# Patient Record
Sex: Male | Born: 1960
Health system: Southern US, Community
[De-identification: ages and names within clinical notes are randomized; demographics above are authoritative.]

## PROBLEM LIST (undated history)

## (undated) DIAGNOSIS — W57XXXA Bitten or stung by nonvenomous insect and other nonvenomous arthropods, initial encounter: Secondary | ICD-10-CM

## (undated) DIAGNOSIS — K219 Gastro-esophageal reflux disease without esophagitis: Secondary | ICD-10-CM

## (undated) DIAGNOSIS — R51 Headache: Secondary | ICD-10-CM

## (undated) DIAGNOSIS — R519 Headache, unspecified: Secondary | ICD-10-CM

## (undated) DIAGNOSIS — I10 Essential (primary) hypertension: Secondary | ICD-10-CM

## (undated) HISTORY — DX: Essential (primary) hypertension: I10

## (undated) HISTORY — PX: TONSILLECTOMY: SUR1361

---

## 1998-03-17 ENCOUNTER — Emergency Department (HOSPITAL_COMMUNITY): Admission: EM | Admit: 1998-03-17 | Discharge: 1998-03-17 | Payer: Self-pay | Admitting: Emergency Medicine

## 2000-05-07 ENCOUNTER — Emergency Department (HOSPITAL_COMMUNITY): Admission: EM | Admit: 2000-05-07 | Discharge: 2000-05-07 | Payer: Self-pay

## 2001-03-06 ENCOUNTER — Encounter: Admission: RE | Admit: 2001-03-06 | Discharge: 2001-03-06 | Payer: Self-pay | Admitting: Internal Medicine

## 2001-03-06 ENCOUNTER — Encounter: Payer: Self-pay | Admitting: Internal Medicine

## 2001-03-11 ENCOUNTER — Encounter: Admission: RE | Admit: 2001-03-11 | Discharge: 2001-03-11 | Payer: Self-pay | Admitting: Internal Medicine

## 2001-03-11 ENCOUNTER — Encounter: Payer: Self-pay | Admitting: Internal Medicine

## 2002-07-10 ENCOUNTER — Encounter: Payer: Self-pay | Admitting: Emergency Medicine

## 2002-07-10 ENCOUNTER — Emergency Department (HOSPITAL_COMMUNITY): Admission: AC | Admit: 2002-07-10 | Discharge: 2002-07-10 | Payer: Self-pay | Admitting: Emergency Medicine

## 2010-04-16 ENCOUNTER — Encounter: Payer: Self-pay | Admitting: Internal Medicine

## 2012-09-22 ENCOUNTER — Other Ambulatory Visit: Payer: Self-pay | Admitting: Neurological Surgery

## 2012-09-22 DIAGNOSIS — M5412 Radiculopathy, cervical region: Secondary | ICD-10-CM

## 2012-10-03 ENCOUNTER — Other Ambulatory Visit: Payer: Self-pay

## 2013-05-14 ENCOUNTER — Other Ambulatory Visit: Payer: Self-pay | Admitting: *Deleted

## 2013-05-14 DIAGNOSIS — R002 Palpitations: Secondary | ICD-10-CM

## 2013-05-28 ENCOUNTER — Encounter (INDEPENDENT_AMBULATORY_CARE_PROVIDER_SITE_OTHER): Payer: BC Managed Care – PPO

## 2013-05-28 ENCOUNTER — Encounter: Payer: Self-pay | Admitting: *Deleted

## 2013-05-28 DIAGNOSIS — R002 Palpitations: Secondary | ICD-10-CM

## 2013-05-28 NOTE — Progress Notes (Signed)
Patient ID: Alexander Gross, male   DOB: 1960/04/29, 53 y.o.   MRN: [id number] E-Cardio 24 hour holter monitor applied to patient.

## 2015-07-29 DIAGNOSIS — L719 Rosacea, unspecified: Secondary | ICD-10-CM | POA: Diagnosis not present

## 2015-07-29 DIAGNOSIS — B352 Tinea manuum: Secondary | ICD-10-CM | POA: Diagnosis not present

## 2015-10-24 ENCOUNTER — Ambulatory Visit (INDEPENDENT_AMBULATORY_CARE_PROVIDER_SITE_OTHER): Payer: BLUE CROSS/BLUE SHIELD | Admitting: Physician Assistant

## 2015-10-24 ENCOUNTER — Ambulatory Visit (HOSPITAL_COMMUNITY)
Admission: RE | Admit: 2015-10-24 | Discharge: 2015-10-24 | Disposition: A | Discharge status: Home / Self Care | Payer: BLUE CROSS/BLUE SHIELD | Source: Ambulatory Visit | Attending: Physician Assistant | Admitting: Physician Assistant

## 2015-10-24 ENCOUNTER — Ambulatory Visit (INDEPENDENT_AMBULATORY_CARE_PROVIDER_SITE_OTHER): Payer: BLUE CROSS/BLUE SHIELD

## 2015-10-24 VITALS — BP 128/90 | HR 114 | Temp 99.1°F | Resp 20 | Ht 68.0 in | Wt 162.0 lb

## 2015-10-24 DIAGNOSIS — J329 Chronic sinusitis, unspecified: Secondary | ICD-10-CM | POA: Diagnosis not present

## 2015-10-24 DIAGNOSIS — Z8661 Personal history of infections of the central nervous system: Secondary | ICD-10-CM

## 2015-10-24 DIAGNOSIS — H53149 Visual discomfort, unspecified: Secondary | ICD-10-CM | POA: Diagnosis present

## 2015-10-24 DIAGNOSIS — R51 Headache: Secondary | ICD-10-CM | POA: Diagnosis present

## 2015-10-24 DIAGNOSIS — J338 Other polyp of sinus: Secondary | ICD-10-CM

## 2015-10-24 DIAGNOSIS — R6889 Other general symptoms and signs: Secondary | ICD-10-CM | POA: Diagnosis not present

## 2015-10-24 DIAGNOSIS — R5081 Fever presenting with conditions classified elsewhere: Secondary | ICD-10-CM | POA: Diagnosis not present

## 2015-10-24 DIAGNOSIS — A879 Viral meningitis, unspecified: Secondary | ICD-10-CM | POA: Diagnosis not present

## 2015-10-24 DIAGNOSIS — R000 Tachycardia, unspecified: Secondary | ICD-10-CM | POA: Diagnosis not present

## 2015-10-24 DIAGNOSIS — G039 Meningitis, unspecified: Secondary | ICD-10-CM | POA: Diagnosis not present

## 2015-10-24 DIAGNOSIS — R05 Cough: Secondary | ICD-10-CM | POA: Diagnosis not present

## 2015-10-24 DIAGNOSIS — R519 Headache, unspecified: Secondary | ICD-10-CM

## 2015-10-24 DIAGNOSIS — W57XXXA Bitten or stung by nonvenomous insect and other nonvenomous arthropods, initial encounter: Secondary | ICD-10-CM | POA: Diagnosis not present

## 2015-10-24 DIAGNOSIS — R509 Fever, unspecified: Secondary | ICD-10-CM | POA: Diagnosis not present

## 2015-10-24 DIAGNOSIS — J309 Allergic rhinitis, unspecified: Secondary | ICD-10-CM | POA: Diagnosis not present

## 2015-10-24 LAB — POCT CBC
GRANULOCYTE PERCENT: 78.7 % (ref 37–80)
HEMATOCRIT: 45.1 % (ref 43.5–53.7)
HEMOGLOBIN: 16 g/dL (ref 14.1–18.1)
Lymph, poc: 0.9 (ref 0.6–3.4)
MCH: 33.2 pg — AB (ref 27–31.2)
MCHC: 35.6 g/dL — AB (ref 31.8–35.4)
MCV: 93.3 fL (ref 80–97)
MID (cbc): 0.5 (ref 0–0.9)
MPV: 6.2 fL (ref 0–99.8)
POC GRANULOCYTE: 5.4 (ref 2–6.9)
POC LYMPH PERCENT: 13.4 %L (ref 10–50)
POC MID %: 7.9 % (ref 0–12)
Platelet Count, POC: 192 10*3/uL (ref 142–424)
RBC: 4.83 M/uL (ref 4.69–6.13)
RDW, POC: 12 %
WBC: 6.9 10*3/uL (ref 4.6–10.2)

## 2015-10-24 IMAGING — CT CT HEAD W/O CM
1 series · 16 of 30 positions shown, 20 images · non-contrast
Comparison: None.

CLINICAL DATA: Headache for 2 days

EXAM:
CT HEAD WITHOUT CONTRAST
TECHNIQUE: Contiguous axial images were obtained from the base of the skull
through the vertex without intravenous contrast.

[Series 2: head w/o · axial · non-contrast · 0.45mm/px · z∈[+157,+296]mm · 16 of 32 slices shown, 20 images]
[im 2/32  brain]
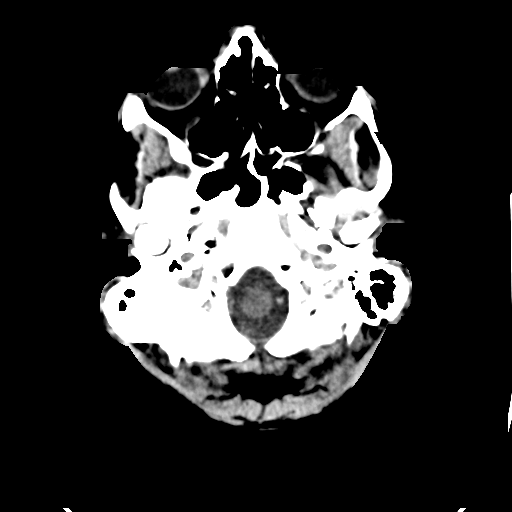
[im 2/32  bone]
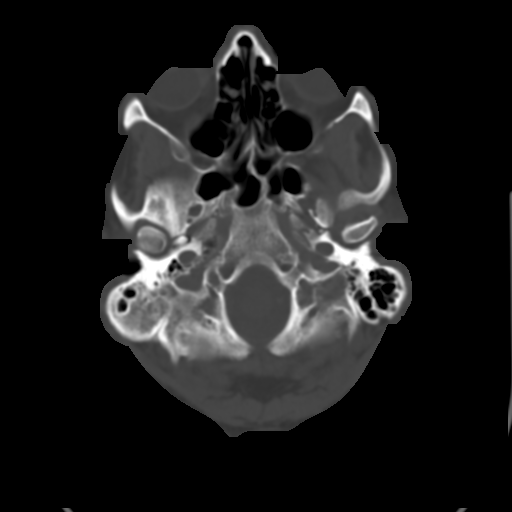
[im 4/32  brain]
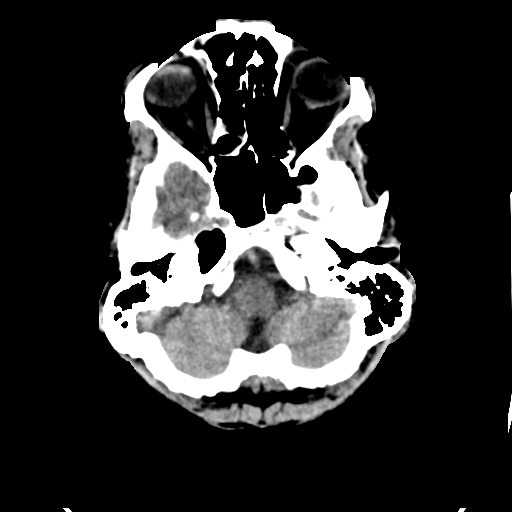
[im 6/32  brain]
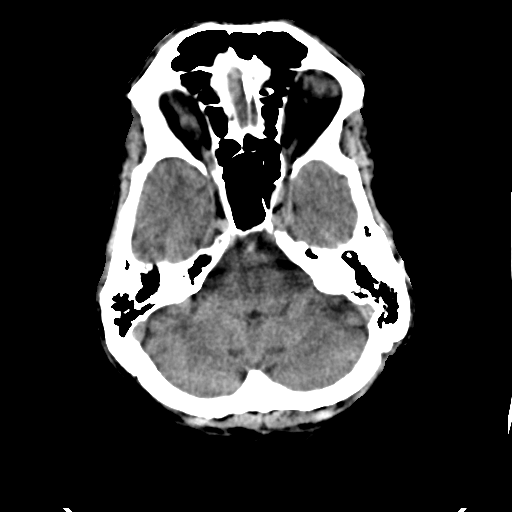
[im 8/32  brain]
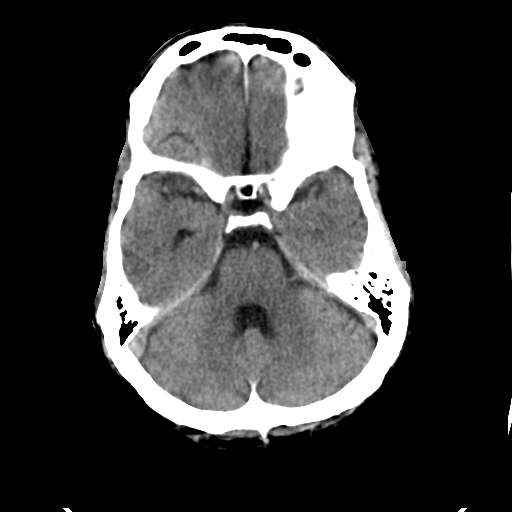
[im 9/32  brain]
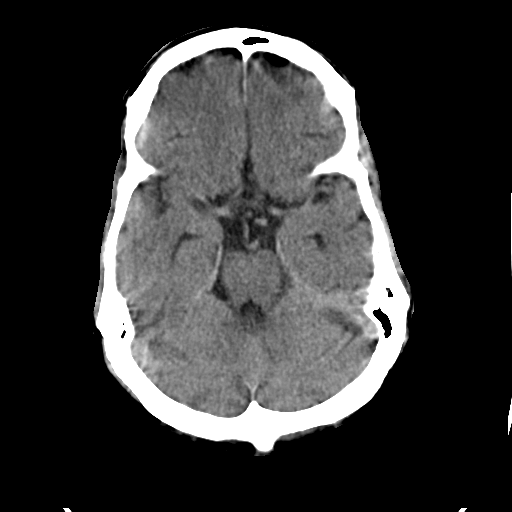
[im 9/32  bone]
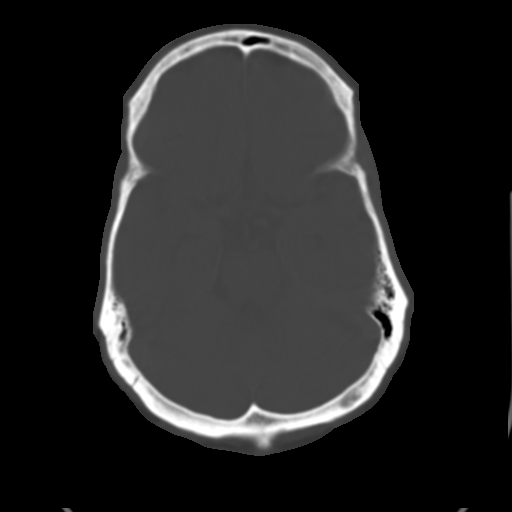
[im 11/32  brain]
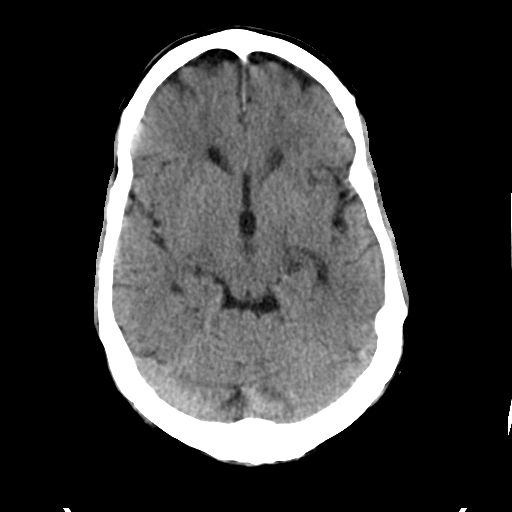
[im 13/32  brain]
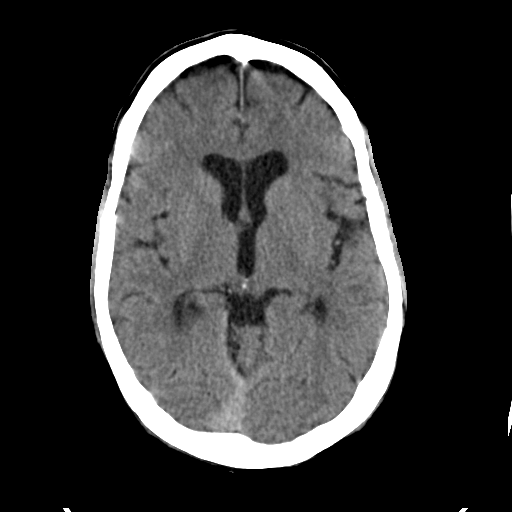
[im 15/32  brain]
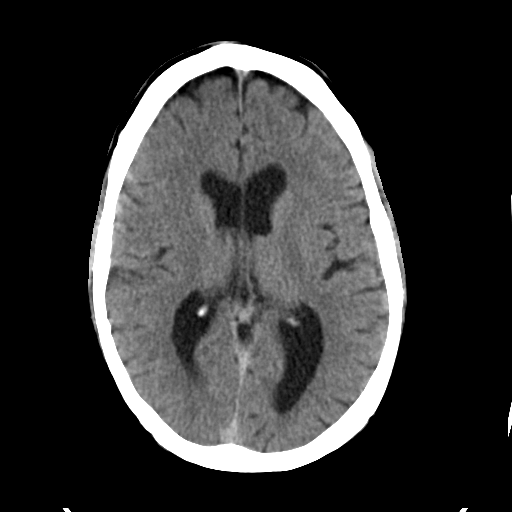
[im 17/32  brain]
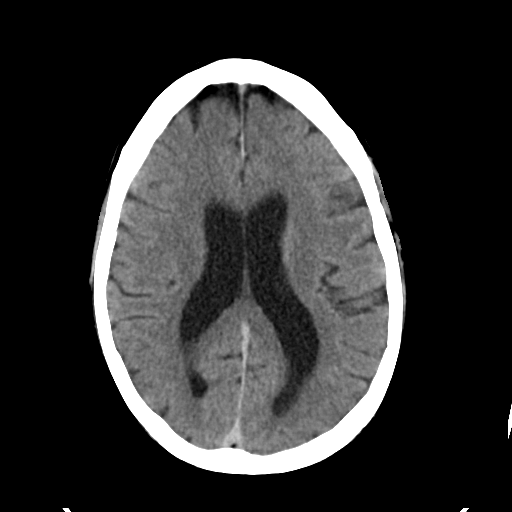
[im 17/32  bone]
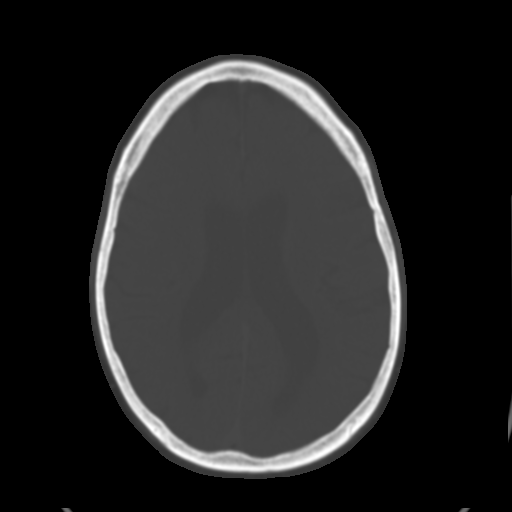
[im 19/32  brain]
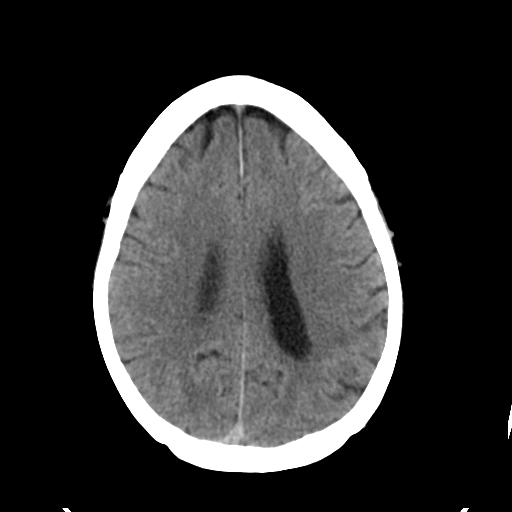
[im 21/32  brain]
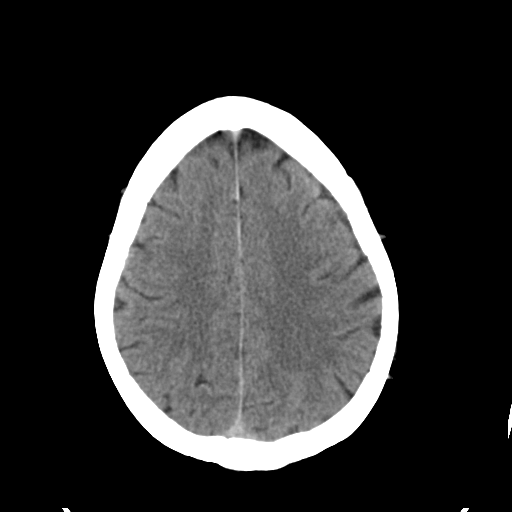
[im 23/32  brain]
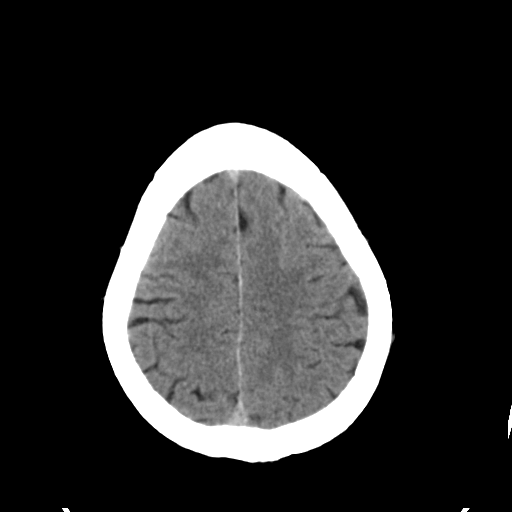
[im 24/32  brain]
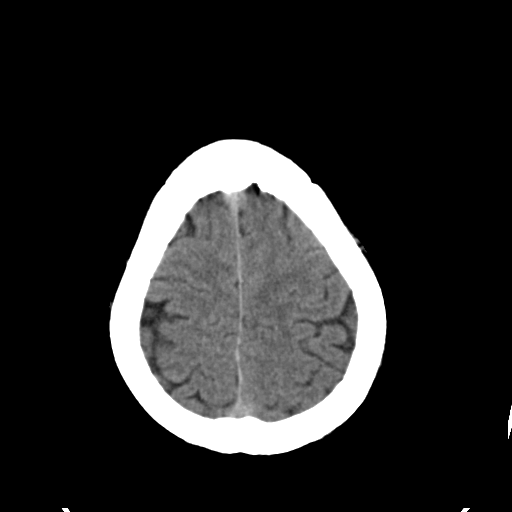
[im 24/32  bone]
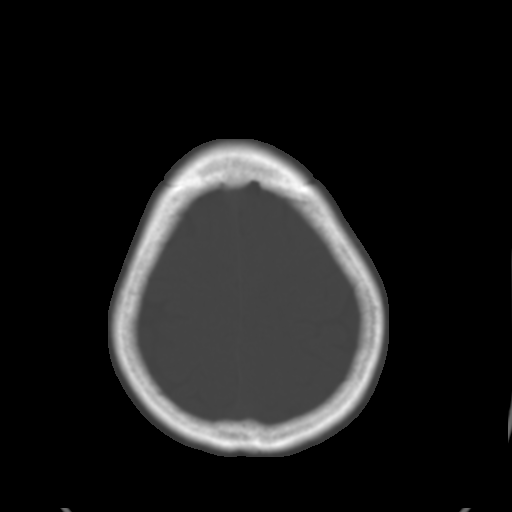
[im 26/32  brain]
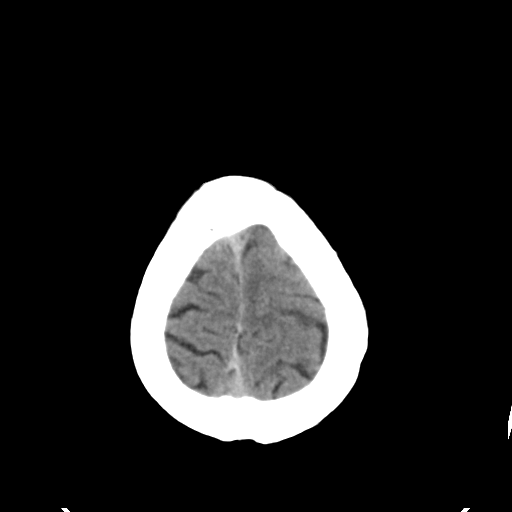
[im 28/32  brain]
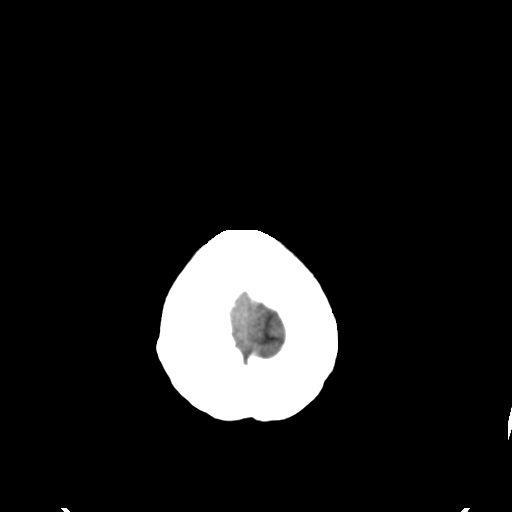
[im 30/32  brain]
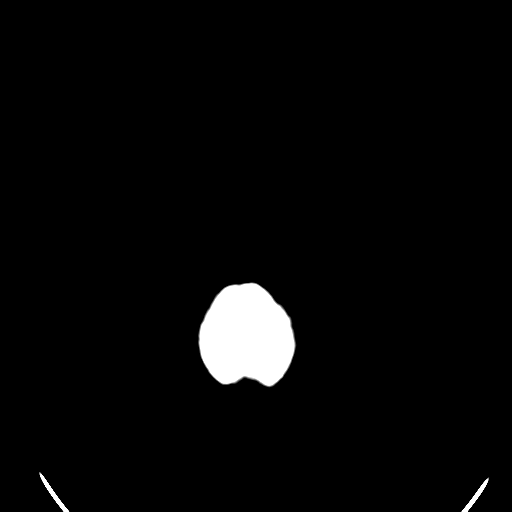

[16 of 30 positions shown; findings below may reference images not displayed]

FINDINGS: There is streak artifact over the right frontal lobe laterally. No
obvious acute hemorrhage. No mass effect midline shift. Mastoid air
cells are clear. Visualized paranasal sinuses are clear. Intact
cranium.
IMPRESSION: No acute intracranial pathology.

## 2015-10-24 IMAGING — DX DG SINUSES 1-2V
1 series · 1 of 1 positions shown · non-contrast
Comparison: Report of head CT scan [DATE] which
indicated the paranasal sinuses were normal where visualized.

CLINICAL DATA: Three days of headache with dental pain; history of
uncontrolled allergies

EXAM:
PARANASAL SINUSES - 1-2 VIEW

[skull waters]
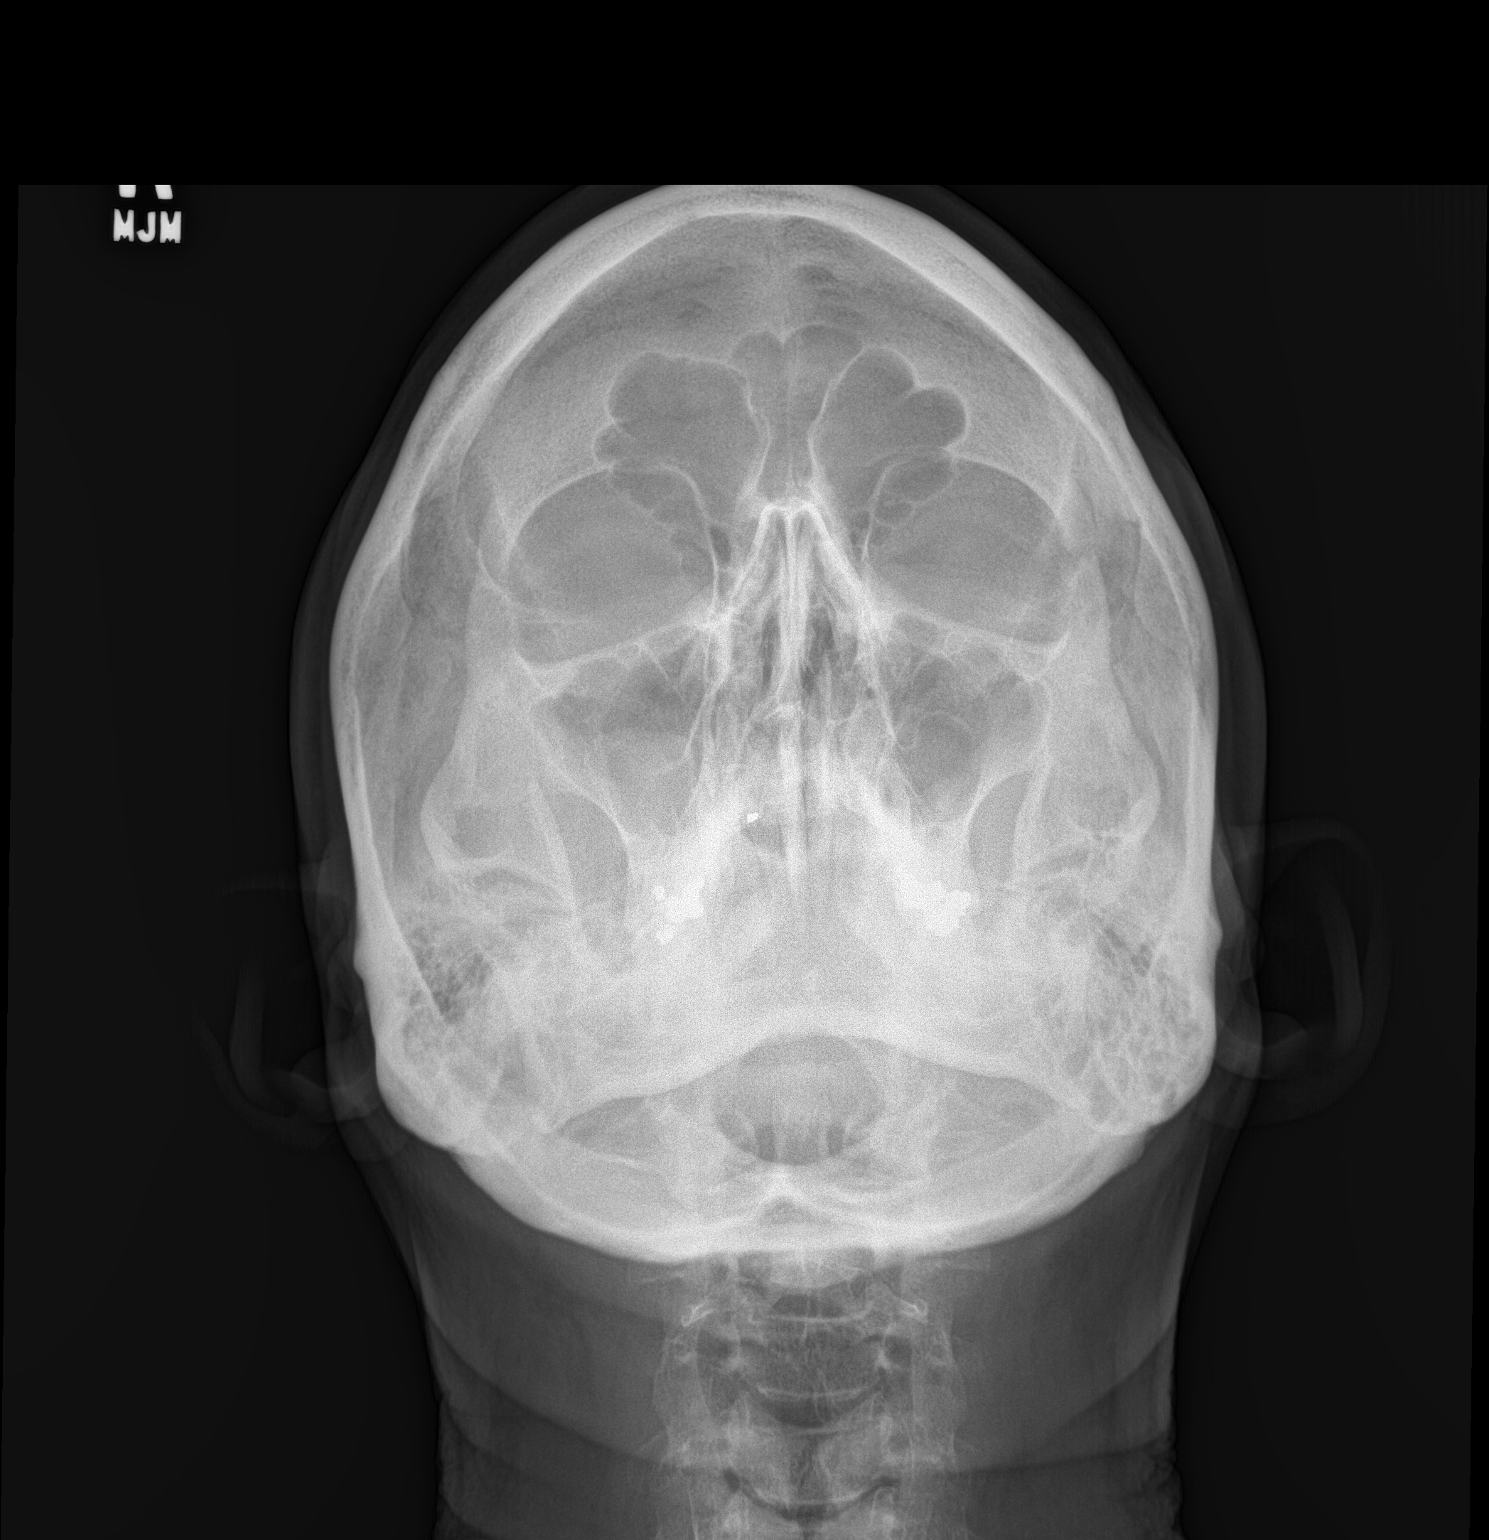

[1 of 1 positions shown; findings below may reference images not displayed]

FINDINGS: There is soft tissue fullness in the inferior aspect of the right
maxillary sinus consistent with a polyp or asymmetric mucoperiosteal
thickening. The left maxillary sinus is clear. The frontal and
ethmoid sinuses are clear.
IMPRESSION: Probable retention cyst or polyp inferiorly in the right maxillary
sinus measuring up to 2 cm in diameter. No air-fluid levels are
observed.

## 2015-10-24 MED ORDER — AMOXICILLIN-POT CLAVULANATE 875-125 MG PO TABS: 1.0000 | ORAL_TABLET | Freq: Two times a day (BID) | ORAL | 0 refills | Status: DC

## 2015-10-24 MED ORDER — GUAIFENESIN ER 1200 MG PO TB12: 1.0000 | ORAL_TABLET | Freq: Two times a day (BID) | ORAL | 0 refills | Status: AC

## 2015-10-24 MED ORDER — HYDROCODONE-ACETAMINOPHEN 5-325 MG PO TABS: 1.0000 | ORAL_TABLET | Freq: Four times a day (QID) | ORAL | 0 refills | Status: DC | PRN

## 2015-10-24 NOTE — Progress Notes (Signed)
Alexander Gross  MRN: [id number] DOB: 18-Aug-1960  Subjective:  Pt presents to clinic with headache and chills that started 3 days ago and is not getting any better - the headache does have moments of improving but it has not resolved.  He has tried tylenol and motrin and neither has helped his headache.  The headache was not abrupt in start but it has progressively worsened.  He feels like he has a sinus infection because his upper teeth hurt and he has allergies that he cannot control.  He is having no nasal drainage but he has a lot of PND esp when showering and that has not changed since his symptoms started.  He has used tylenol and motrin without any relief in his symptoms.  He is having no weakness or paresthesias.  He does not feel sick but he is weak because he has not slept from the pain.  He cannot control his shaking but he is not cold.  Pt has h/o migraines but this feel different - he is having some photophobia today with the headache.  He has known allergies - claritin and flonase daily - he does not feel like it is helping - his PCP has been work with him  Review of Systems  Constitutional: Positive for chills. Negative for fever.  HENT: Positive for dental problem (upper teeth pain) and sinus pressure. Negative for facial swelling and sore throat.   Respiratory: Negative for cough, shortness of breath and wheezing.   Allergic/Immunologic: Positive for environmental allergies.  Neurological: Positive for headaches. Negative for dizziness, facial asymmetry, weakness and light-headedness.    There are no active problems to display for this patient.   No current outpatient prescriptions on file prior to visit.   No current facility-administered medications on file prior to visit.     No Known Allergies  Objective:  BP 128/90 (BP Location: Left Arm, Patient Position: Sitting, Cuff Size: Normal)   Pulse (!) 114   Temp 99.1 F (37.3 C) (Oral)   Resp 20   Ht 5\' 8"  (1.727  m)   Wt 162 lb (73.5 kg)   BMI 24.63 kg/m   Physical Exam  Constitutional: He is oriented to person, place, and time and well-developed, well-nourished, and in no distress.  Rigors, appears to not feel well  HENT:  Head: Normocephalic and atraumatic.  Right Ear: Hearing, tympanic membrane, external ear and ear canal normal.  Left Ear: Hearing, tympanic membrane, external ear and ear canal normal.  Nose: Mucosal edema (red) present.  Mouth/Throat: Uvula is midline, oropharynx is clear and moist and mucous membranes are normal.  Eyes: Conjunctivae, EOM and lids are normal. Pupils are equal, round, and reactive to light.  No photophobia  Neck: Normal range of motion.  Cardiovascular: Normal rate, regular rhythm and normal heart sounds.   No murmur heard. Pulmonary/Chest: Effort normal and breath sounds normal. He has no wheezes.  Lymphadenopathy:       Head (right side): No tonsillar adenopathy present.       Head (left side): No tonsillar adenopathy present.    He has no cervical adenopathy.       Right: No supraclavicular adenopathy present.       Left: No supraclavicular adenopathy present.  Neurological: He is alert and oriented to person, place, and time. Gait normal.  Skin: Skin is warm and dry.  Psychiatric: Mood, memory, affect and judgment normal.   Dg Sinus 1-2 Views  Result Date: 10/24/2015 CLINICAL DATA:  Three days of headache with dental pain; history of uncontrolled allergies EXAM: PARANASAL SINUSES - 1-2 VIEW COMPARISON:  Report of head CT scan of July 10, 2002 which indicated the paranasal sinuses were normal where visualized. FINDINGS: There is soft tissue fullness in the inferior aspect of the right maxillary sinus consistent with a polyp or asymmetric mucoperiosteal thickening. The left maxillary sinus is clear. The frontal and ethmoid sinuses are clear. IMPRESSION: Probable retention cyst or polyp inferiorly in the right maxillary sinus measuring up to 2 cm in  diameter. No air-fluid levels are observed. Electronically Signed   By: David  [place] M.D.   On: 10/24/2015 10:43  Ct Head Wo Contrast  Result Date: 10/24/2015 CLINICAL DATA:  Headache for 2 days EXAM: CT HEAD WITHOUT CONTRAST TECHNIQUE: Contiguous axial images were obtained from the base of the skull through the vertex without intravenous contrast. COMPARISON:  None. FINDINGS: There is streak artifact over the right frontal lobe laterally. No obvious acute hemorrhage. No mass effect midline shift. Mastoid air cells are clear. Visualized paranasal sinuses are clear. Intact cranium. IMPRESSION: No acute intracranial pathology. Electronically Signed   By: Jolaine Click M.D.   On: 10/24/2015 12:57   Results for orders placed or performed in visit on 10/24/15  POCT CBC  Result Value Ref Range   WBC 6.9 4.6 - 10.2 K/uL   Lymph, poc 0.9 0.6 - 3.4   POC LYMPH PERCENT 13.4 10 - 50 %L   MID (cbc) 0.5 0 - 0.9   POC MID % 7.9 0 - 12 %M   POC Granulocyte 5.4 2 - 6.9   Granulocyte percent 78.7 37 - 80 %G   RBC 4.83 4.69 - 6.13 M/uL   Hemoglobin 16.0 14.1 - 18.1 g/dL   HCT, POC 40.9 81.1 - 53.7 %   MCV 93.3 80 - 97 fL   MCH, POC 33.2 (A) 27 - 31.2 pg   MCHC 35.6 (A) 31.8 - 35.4 g/dL   RDW, POC 91.4 %   Platelet Count, POC 192 142 - 424 K/uL   MPV 6.2 0 - 99.8 fL    Assessment and Plan :  Acute intractable headache, unspecified headache type - Plan: POCT CBC, DG SinUS 1-2 Views, HYDROcodone-acetaminophen (NORCO/VICODIN) 5-325 MG tablet, CT Head Wo Contrast, CANCELED: CT Head Wo Contrast  Allergic rhinitis, unspecified allergic rhinitis type  Maxillary sinus polyp - Plan: Ambulatory referral to ENT, amoxicillin-clavulanate (AUGMENTIN) 875-125 MG tablet, Guaifenesin (MUCINEX MAXIMUM STRENGTH) 1200 MG TB12   Pt has normal CBC and water's sinus film did not show air fluid levels - the headache seems severe for a sinus infection and with the fact that it is worsening and the patient has not been able to  sleep we will get a head CT - he was given pain medications that should help with his pain - he will be treated based on his CT results.  He understood and agreed with the plan.  CT was normal - this is likely a migraine - I sent in abx and mucinex if the patient's headache is not resolved with the narcotic medication he was given due to uncontrolled allergies.  He will go to the referral for ENT due to possible sinus polyp - he was instructed to continue Flonase which may help decrease this in size --  If the pain worsens he should be re-evaluated  Benny Lennert PA-C  [facility] 10/24/2015 1:21 PM

## 2015-10-24 NOTE — Patient Instructions (Addendum)
You are to go over to [facility] now for your CT scan now.  Address: [address] [place], [place], [place] [address]  [phone]   We need to do imaging of your head to r/o problems with your brain - I have given you pain medication to help with the pain. When I get the results the treatment weill be determined.  I have also referred you to ENT because of the polyp in your right sinus cavity.    IF you received an x-ray today, you will receive an invoice from [facility]. Please contact [facility] at [phone] with questions or concerns regarding your invoice.   IF you received labwork today, you will receive an invoice from [employer]. Please contact [facility] at [phone] with questions or concerns regarding your invoice.   Our billing staff will not be able to assist you with questions regarding bills from these companies.  You will be contacted with the lab results as soon as they are available. The fastest way to get your results is to activate your My Chart account. Instructions are located on the last page of this paperwork. If you have not heard from [place] regarding the results in 2 weeks, please contact this office.

## 2015-10-25 ENCOUNTER — Encounter (HOSPITAL_COMMUNITY): Payer: Self-pay | Admitting: Emergency Medicine

## 2015-10-25 ENCOUNTER — Emergency Department (HOSPITAL_COMMUNITY)
Admission: EM | Admit: 2015-10-25 | Discharge: 2015-10-26 | Disposition: A | Discharge status: Home / Self Care | Payer: BLUE CROSS/BLUE SHIELD | Source: Home / Self Care | Attending: Emergency Medicine | Admitting: Emergency Medicine

## 2015-10-25 ENCOUNTER — Telehealth: Payer: Self-pay

## 2015-10-25 ENCOUNTER — Emergency Department (HOSPITAL_COMMUNITY): Payer: BLUE CROSS/BLUE SHIELD

## 2015-10-25 DIAGNOSIS — Z792 Long term (current) use of antibiotics: Secondary | ICD-10-CM | POA: Insufficient documentation

## 2015-10-25 DIAGNOSIS — R509 Fever, unspecified: Secondary | ICD-10-CM

## 2015-10-25 DIAGNOSIS — Z79899 Other long term (current) drug therapy: Secondary | ICD-10-CM | POA: Insufficient documentation

## 2015-10-25 DIAGNOSIS — R05 Cough: Secondary | ICD-10-CM

## 2015-10-25 DIAGNOSIS — R059 Cough, unspecified: Secondary | ICD-10-CM

## 2015-10-25 DIAGNOSIS — F17290 Nicotine dependence, other tobacco product, uncomplicated: Secondary | ICD-10-CM | POA: Insufficient documentation

## 2015-10-25 DIAGNOSIS — R231 Pallor: Secondary | ICD-10-CM | POA: Insufficient documentation

## 2015-10-25 LAB — I-STAT CHEM 8, ED
BUN: 4 mg/dL — AB (ref 6–20)
CALCIUM ION: 1.13 mmol/L (ref 1.13–1.30)
Chloride: 95 mmol/L — ABNORMAL LOW (ref 101–111)
Creatinine, Ser: 0.9 mg/dL (ref 0.61–1.24)
Glucose, Bld: 119 mg/dL — ABNORMAL HIGH (ref 65–99)
HEMATOCRIT: 44 % (ref 39.0–52.0)
HEMOGLOBIN: 15 g/dL (ref 13.0–17.0)
Potassium: 3.7 mmol/L (ref 3.5–5.1)
SODIUM: 132 mmol/L — AB (ref 135–145)
TCO2: 25 mmol/L (ref 0–100)

## 2015-10-25 LAB — CBC WITH DIFFERENTIAL/PLATELET
BASOS PCT: 0 %
Basophils Absolute: 0 10*3/uL (ref 0.0–0.1)
EOS ABS: 0 10*3/uL (ref 0.0–0.7)
EOS PCT: 0 %
HCT: 40.9 % (ref 39.0–52.0)
HEMOGLOBIN: 14.8 g/dL (ref 13.0–17.0)
LYMPHS ABS: 1 10*3/uL (ref 0.7–4.0)
Lymphocytes Relative: 10 %
MCH: 32.9 pg (ref 26.0–34.0)
MCHC: 36.2 g/dL — AB (ref 30.0–36.0)
MCV: 90.9 fL (ref 78.0–100.0)
MONO ABS: 1 10*3/uL (ref 0.1–1.0)
MONOS PCT: 10 %
NEUTROS PCT: 80 %
Neutro Abs: 8.2 10*3/uL — ABNORMAL HIGH (ref 1.7–7.7)
Platelets: 179 10*3/uL (ref 150–400)
RBC: 4.5 MIL/uL (ref 4.22–5.81)
RDW: 11.8 % (ref 11.5–15.5)
WBC: 10.1 10*3/uL (ref 4.0–10.5)

## 2015-10-25 LAB — I-STAT CG4 LACTIC ACID, ED: Lactic Acid, Venous: 0.91 mmol/L (ref 0.5–1.9)

## 2015-10-25 MED ORDER — METOCLOPRAMIDE HCL 5 MG/ML IJ SOLN
10.0000 mg | Freq: Once | INTRAMUSCULAR | Status: AC
  Administered 2015-10-25: 10 mg via INTRAVENOUS
  Filled 2015-10-25: qty 2

## 2015-10-25 MED ORDER — KETOROLAC TROMETHAMINE 30 MG/ML IJ SOLN
30.0000 mg | Freq: Once | INTRAMUSCULAR | Status: AC
  Administered 2015-10-25: 30 mg via INTRAVENOUS
  Filled 2015-10-25: qty 1

## 2015-10-25 MED ORDER — SODIUM CHLORIDE 0.9 % IV BOLUS (SEPSIS)
1000.0000 mL | Freq: Once | INTRAVENOUS | Status: AC
  Administered 2015-10-25: 1000 mL via INTRAVENOUS

## 2015-10-25 MED ORDER — ACETAMINOPHEN 325 MG PO TABS
650.0000 mg | ORAL_TABLET | Freq: Once | ORAL | Status: AC | PRN
  Administered 2015-10-25: 650 mg via ORAL
  Filled 2015-10-25: qty 2

## 2015-10-25 NOTE — Telephone Encounter (Signed)
This patient needs re-evaluation. If he cannot be scheduled here this afternoon, he should go to the [facility] or the ED for additional evaluation.

## 2015-10-25 NOTE — Telephone Encounter (Signed)
Pt's wife advised to go to ER or urgent care.

## 2015-10-25 NOTE — ED Triage Notes (Addendum)
Pt BIB wife with high temperature. Pt has had a headache and some N/V. Pt c/o neck pain one week ago but also has chronic neck pain. Wife also mentions that patient has had a cough that he feels is from the post nasal drip. Alert and oriented.

## 2015-10-25 NOTE — ED Provider Notes (Signed)
[facility]-EMERGENCY DEPT Provider Note   CSN: [id number] Arrival date & time: 10/25/15  2138  First Provider Contact:   First MD Initiated Contact with Patient 10/25/15 2328      By signing my name below, I, Emmanuella Mensah, attest that this documentation has been prepared under the direction and in the presence of Shyanne Mcclary, MD. Electronically Signed: Angelene Giovanni, ED Scribe. 10/25/15. 2:22 AM.   History   Chief Complaint Chief Complaint  Patient presents with  . Fever    HPI Comments: Alexander Gross is a 55 y.o. male who presents to the Emergency Department complaining of ongoing fever (highest 102.7 PTA) onset 4 days ago. He reports associated dull headache, persistent non-productive cough, and chills. His wife adds that pt had episodes where he would be become pale with pinpoint pupils. Pt reports that he was seen by his PCP on 10/24/2015 for these symptoms and given Amoxicillin and Hydrocodone. He also had a normal sinus x-ray, head CT, and white count at that time. He notes that he has been exposed to tick and denies any testing specific to the tick when he saw his PCP. He states that he is here because his symptoms are progressing and was advised to come to the ED. No SOB or n/v/d,.   The history is provided by the patient. No language interpreter was used.  Fever   The incident occurred more than 2 days ago. He came to the ER via walk-in. Pertinent negatives include no vomiting. He has tried prescription drugs for the symptoms. The treatment provided no relief.    History reviewed. No pertinent past medical history.  There are no active problems to display for this patient.   History reviewed. No pertinent surgical history.     Home Medications    Prior to Admission medications   Medication Sig Start Date End Date Taking? Authorizing Provider  acetaminophen (TYLENOL) 500 MG tablet Take 1,000 mg by mouth every 6 (six) hours as needed for mild pain.   Yes Historical  Provider, MD  amoxicillin-clavulanate (AUGMENTIN) 875-125 MG tablet Take 1 tablet by mouth 2 (two) times daily. 10/24/15  Yes Morrell Riddle, PA-C  doxycycline (VIBRAMYCIN) 100 MG capsule Take 100 mg by mouth daily.   Yes Historical Provider, MD  Guaifenesin (MUCINEX MAXIMUM STRENGTH) 1200 MG TB12 Take 1 tablet (1,200 mg total) by mouth 2 (two) times daily. 10/24/15 10/31/15 Yes Sarah Harvie Bridge, PA-C  HYDROcodone-acetaminophen (NORCO/VICODIN) 5-325 MG tablet Take 1 tablet by mouth every 6 (six) hours as needed for moderate pain. 10/24/15  Yes Morrell Riddle, PA-C  loratadine (CLARITIN) 10 MG tablet Take 10 mg by mouth daily.   Yes Historical Provider, MD    Family History History reviewed. No pertinent family history.  Social History Social History  Substance Use Topics  . Smoking status: Never Smoker  . Smokeless tobacco: Current User  . Alcohol use 3.6 oz/week    6 Cans of beer per week     Allergies   Review of patient's allergies indicates no known allergies.   Review of Systems Review of Systems  Constitutional: Positive for chills and fever.  Respiratory: Positive for cough. Negative for shortness of breath.   Gastrointestinal: Negative for diarrhea, nausea and vomiting.  Skin: Positive for pallor.  Neurological: Positive for headaches.  All other systems reviewed and are negative.    Physical Exam Updated Vital Signs BP 148/91   Pulse 98   Temp (!) 103.1 F (39.5 C) (Oral)  Resp 20   Ht 5\' 8"  (1.727 m)   Wt 162 lb (73.5 kg)   SpO2 97%   BMI 24.63 kg/m   Physical Exam  Constitutional: He is oriented to person, place, and time. He appears well-developed and well-nourished. No distress.  HENT:  Head: Normocephalic and atraumatic.  Right Ear: Tympanic membrane and external ear normal. No mastoid tenderness. Tympanic membrane is not injected, not scarred, not perforated and not erythematous.  Left Ear: Tympanic membrane and external ear normal. No mastoid tenderness.  Tympanic membrane is not injected, not scarred, not perforated and not erythematous.  Nose: Nose normal.  Mouth/Throat: Oropharynx is clear and moist.  Eyes: Conjunctivae and EOM are normal.  Pin point pupil  Neck: Trachea normal, normal range of motion and full passive range of motion without pain. Neck supple. No tracheal tenderness, no spinous process tenderness and no muscular tenderness present. Carotid bruit is not present. No neck rigidity. No tracheal deviation present. No Brudzinski's sign and no Kernig's sign noted. No thyromegaly present.  No meningismus   Cardiovascular: Normal rate, regular rhythm and normal heart sounds.   Pulmonary/Chest: Effort normal and breath sounds normal. No stridor. No respiratory distress.  Lungs clear  Abdominal: Soft. Bowel sounds are normal. He exhibits no ascites and no mass. There is no hepatosplenomegaly. There is no rigidity, no guarding, no tenderness at McBurney's point and negative Murphy's sign.  Musculoskeletal: Normal range of motion.  All compartments soft Intact distal pulses No supraclavicular adenopathy No axillary, popliteal adenopathy or any adenopathy throughout  Lymphadenopathy:       Head (right side): No submental, no submandibular, no tonsillar, no preauricular, no posterior auricular and no occipital adenopathy present.       Head (left side): No submental, no submandibular, no tonsillar, no preauricular, no posterior auricular and no occipital adenopathy present.    He has no cervical adenopathy.       Right cervical: No superficial cervical, no deep cervical and no posterior cervical adenopathy present.      Left cervical: No superficial cervical, no deep cervical and no posterior cervical adenopathy present.    He has no axillary adenopathy.       Right: No inguinal, no supraclavicular and no epitrochlear adenopathy present.       Left: No inguinal, no supraclavicular and no epitrochlear adenopathy present.  Neurological: He  is alert and oriented to person, place, and time.  Skin: Skin is warm and dry. Capillary refill takes less than 2 seconds. No cyanosis. No pallor.  No lesions on wrist and hands, no petechiae  Psychiatric: He has a normal mood and affect. His behavior is normal.  Nursing note and vitals reviewed.    ED Treatments / Results  DIAGNOSTIC STUDIES: Oxygen Saturation is 97% on RA, normal by my interpretation.    COORDINATION OF CARE:   Labs (all labs ordered are listed, but only abnormal results are displayed) Labs Reviewed - No data to display  EKG  EKG Interpretation None       Radiology Dg Sinus 1-2 Views  Result Date: 10/24/2015 CLINICAL DATA:  Three days of headache with dental pain; history of uncontrolled allergies EXAM: PARANASAL SINUSES - 1-2 VIEW COMPARISON:  Report of head CT scan of Carrine Kroboth 16, 2004 which indicated the paranasal sinuses were normal where visualized. FINDINGS: There is soft tissue fullness in the inferior aspect of the right maxillary sinus consistent with a polyp or asymmetric mucoperiosteal thickening. The left maxillary sinus is clear.  The frontal and ethmoid sinuses are clear. IMPRESSION: Probable retention cyst or polyp inferiorly in the right maxillary sinus measuring up to 2 cm in diameter. No air-fluid levels are observed. Electronically Signed   By: David  [place] M.D.   On: 10/24/2015 10:43  Ct Head Wo Contrast  Result Date: 10/24/2015 CLINICAL DATA:  Headache for 2 days EXAM: CT HEAD WITHOUT CONTRAST TECHNIQUE: Contiguous axial images were obtained from the base of the skull through the vertex without intravenous contrast. COMPARISON:  None. FINDINGS: There is streak artifact over the right frontal lobe laterally. No obvious acute hemorrhage. No mass effect midline shift. Mastoid air cells are clear. Visualized paranasal sinuses are clear. Intact cranium. IMPRESSION: No acute intracranial pathology. Electronically Signed   By: Jolaine Click M.D.   On:  10/24/2015 12:57    Procedures Procedures (including critical care time)  Medications Ordered in ED Medications  acetaminophen (TYLENOL) tablet 650 mg (650 mg Oral Given 10/25/15 2155)     Initial Impression / Assessment and Plan / ED Course  Millena Callins, MD has reviewed the triage vital signs and the nursing notes.  Pertinent labs & imaging results that were available during my care of the patient were reviewed by me and considered in my medical decision making (see chart for details).  Clinical Course   Vitals:   10/25/15 2330 10/25/15 2334  BP:  155/92  Pulse:  95  Resp:  17  Temp: 101.3 F (38.5 C)    Results for orders placed or performed during the hospital encounter of 10/25/15  CBC with Differential/Platelet  Result Value Ref Range   WBC 10.1 4.0 - 10.5 K/uL   RBC 4.50 4.22 - 5.81 MIL/uL   Hemoglobin 14.8 13.0 - 17.0 g/dL   HCT 16.1 09.6 - 04.5 %   MCV 90.9 78.0 - 100.0 fL   MCH 32.9 26.0 - 34.0 pg   MCHC 36.2 (H) 30.0 - 36.0 g/dL   RDW 40.9 81.1 - 91.4 %   Platelets 179 150 - 400 K/uL   Neutrophils Relative % 80 %   Neutro Abs 8.2 (H) 1.7 - 7.7 K/uL   Lymphocytes Relative 10 %   Lymphs Abs 1.0 0.7 - 4.0 K/uL   Monocytes Relative 10 %   Monocytes Absolute 1.0 0.1 - 1.0 K/uL   Eosinophils Relative 0 %   Eosinophils Absolute 0.0 0.0 - 0.7 K/uL   Basophils Relative 0 %   Basophils Absolute 0.0 0.0 - 0.1 K/uL  I-stat chem 8, ed  Result Value Ref Range   Sodium 132 (L) 135 - 145 mmol/L   Potassium 3.7 3.5 - 5.1 mmol/L   Chloride 95 (L) 101 - 111 mmol/L   BUN 4 (L) 6 - 20 mg/dL   Creatinine, Ser 7.82 0.61 - 1.24 mg/dL   Glucose, Bld 956 (H) 65 - 99 mg/dL   Calcium, Ion 2.13 0.86 - 1.30 mmol/L   TCO2 25 0 - 100 mmol/L   Hemoglobin 15.0 13.0 - 17.0 g/dL   HCT 57.8 46.9 - 62.9 %  I-Stat CG4 Lactic Acid, ED  Result Value Ref Range   Lactic Acid, Venous 0.91 0.5 - 1.9 mmol/L   Dg Sinus 1-2 Views  Result Date: 10/24/2015 CLINICAL DATA:  Three days of  headache with dental pain; history of uncontrolled allergies EXAM: PARANASAL SINUSES - 1-2 VIEW COMPARISON:  Report of head CT scan of [place] T[place] 16, 2004 which indicated the paranasal sinuses were normal where visualized. FINDINGS: There is soft  tissue fullness in the inferior aspect of the right maxillary sinus consistent with a polyp or asymmetric mucoperiosteal thickening. The left maxillary sinus is clear. The frontal and ethmoid sinuses are clear. IMPRESSION: Probable retention cyst or polyp inferiorly in the right maxillary sinus measuring up to 2 cm in diameter. No air-fluid levels are observed. Electronically Signed   By: David  [place] M.D.   On: 10/24/2015 10:43  Ct Head Wo Contrast  Result Date: 10/24/2015 CLINICAL DATA:  Headache for 2 days EXAM: CT HEAD WITHOUT CONTRAST TECHNIQUE: Contiguous axial images were obtained from the base of the skull through the vertex without intravenous contrast. COMPARISON:  None. FINDINGS: There is streak artifact over the right frontal lobe laterally. No obvious acute hemorrhage. No mass effect midline shift. Mastoid air cells are clear. Visualized paranasal sinuses are clear. Intact cranium. IMPRESSION: No acute intracranial pathology. Electronically Signed   By: Jolaine Click M.D.   On: 10/24/2015 12:57   Medications  acetaminophen (TYLENOL) tablet 650 mg (650 mg Oral Given 10/25/15 2155)  ketorolac (TORADOL) 30 MG/ML injection 30 mg (30 mg Intravenous Given 10/25/15 2353)  sodium chloride 0.9 % bolus 1,000 mL (0 mLs Intravenous Stopped 10/26/15 0129)  metoCLOPramide (REGLAN) injection 10 mg (10 mg Intravenous Given 10/25/15 2352)     Final Clinical Impressions(s) / ED Diagnoses   Final diagnoses:  None   Well appearing, no signs of sepsis.  I suspect this is viral but will cover for tick borne illness 11:38 PM- Pt advised of plan for treatment and pt agrees. He will receive lab work and chest x-ray for further evaluation.  2:20 AM - Will treat for tick-bourne  illness; switch Amoxicillin with Doxycyline to cover for tick borne illness. Advised to follow up with PCP in the am for tick-borne illness testing. No signs of bacterial meningitis no indication for LP normal head ct.  Alternate tylenol and ibuprofen for fever control New Prescriptions New Prescriptions   No medications on file  All questions answered to patient's satisfaction. Based on history and exam patient has been appropriately medically screened and emergency conditions excluded. Patient is stable for discharge at this time. Follow up with your PMDfor recheck in 2 daysand strict return precautions given.   Cy Blamer, MD 10/26/15 [phone]

## 2015-10-25 NOTE — Telephone Encounter (Signed)
Patient was seen yesterday. Patient still have high fever ranging from 101-102. He is still experiencing headache and not able to eat. Patient stated his neck is still a little sore. [phone].

## 2015-10-25 NOTE — Telephone Encounter (Signed)
Please advise 

## 2015-10-26 ENCOUNTER — Telehealth: Payer: Self-pay

## 2015-10-26 ENCOUNTER — Encounter (HOSPITAL_COMMUNITY): Payer: Self-pay | Admitting: Neurology

## 2015-10-26 ENCOUNTER — Inpatient Hospital Stay (HOSPITAL_COMMUNITY)
Admission: EM | Admit: 2015-10-26 | Discharge: 2015-10-30 | DRG: 076 | Disposition: A | Discharge status: Home / Self Care | Payer: BLUE CROSS/BLUE SHIELD | Attending: Family Medicine | Admitting: Family Medicine

## 2015-10-26 ENCOUNTER — Encounter (HOSPITAL_COMMUNITY): Payer: Self-pay | Admitting: Emergency Medicine

## 2015-10-26 DIAGNOSIS — R51 Headache: Secondary | ICD-10-CM | POA: Diagnosis present

## 2015-10-26 DIAGNOSIS — R5081 Fever presenting with conditions classified elsewhere: Secondary | ICD-10-CM | POA: Diagnosis not present

## 2015-10-26 DIAGNOSIS — G039 Meningitis, unspecified: Secondary | ICD-10-CM

## 2015-10-26 DIAGNOSIS — Z8661 Personal history of infections of the central nervous system: Secondary | ICD-10-CM | POA: Diagnosis not present

## 2015-10-26 DIAGNOSIS — R6889 Other general symptoms and signs: Secondary | ICD-10-CM | POA: Diagnosis not present

## 2015-10-26 DIAGNOSIS — W57XXXA Bitten or stung by nonvenomous insect and other nonvenomous arthropods, initial encounter: Secondary | ICD-10-CM | POA: Diagnosis not present

## 2015-10-26 DIAGNOSIS — H53149 Visual discomfort, unspecified: Secondary | ICD-10-CM | POA: Diagnosis present

## 2015-10-26 DIAGNOSIS — A879 Viral meningitis, unspecified: Secondary | ICD-10-CM | POA: Diagnosis present

## 2015-10-26 DIAGNOSIS — J329 Chronic sinusitis, unspecified: Secondary | ICD-10-CM | POA: Diagnosis present

## 2015-10-26 DIAGNOSIS — R000 Tachycardia, unspecified: Secondary | ICD-10-CM | POA: Diagnosis not present

## 2015-10-26 DIAGNOSIS — R509 Fever, unspecified: Secondary | ICD-10-CM | POA: Diagnosis not present

## 2015-10-26 HISTORY — DX: Headache, unspecified: R51.9

## 2015-10-26 HISTORY — DX: Gastro-esophageal reflux disease without esophagitis: K21.9

## 2015-10-26 HISTORY — DX: Headache: R51

## 2015-10-26 HISTORY — DX: Bitten or stung by nonvenomous insect and other nonvenomous arthropods, initial encounter: W57.XXXA

## 2015-10-26 LAB — CBC
HCT: 41.3 % (ref 39.0–52.0)
HEMATOCRIT: 38.3 % — AB (ref 39.0–52.0)
HEMOGLOBIN: 14.3 g/dL (ref 13.0–17.0)
HEMOGLOBIN: 13.1 g/dL (ref 13.0–17.0)
MCH: 32.4 pg (ref 26.0–34.0)
MCH: 32.1 pg (ref 26.0–34.0)
MCHC: 34.6 g/dL (ref 30.0–36.0)
MCHC: 34.2 g/dL (ref 30.0–36.0)
MCV: 93.7 fL (ref 78.0–100.0)
MCV: 93.9 fL (ref 78.0–100.0)
PLATELETS: 157 10*3/uL (ref 150–400)
Platelets: 171 10*3/uL (ref 150–400)
RBC: 4.41 MIL/uL (ref 4.22–5.81)
RBC: 4.08 MIL/uL — AB (ref 4.22–5.81)
RDW: 11.8 % (ref 11.5–15.5)
RDW: 11.9 % (ref 11.5–15.5)
WBC: 8.2 10*3/uL (ref 4.0–10.5)
WBC: 8.2 10*3/uL (ref 4.0–10.5)

## 2015-10-26 LAB — I-STAT CHEM 8, ED
BUN: 7 mg/dL (ref 6–20)
CREATININE: 0.9 mg/dL (ref 0.61–1.24)
Calcium, Ion: 1.01 mmol/L — ABNORMAL LOW (ref 1.13–1.30)
Chloride: 99 mmol/L — ABNORMAL LOW (ref 101–111)
Glucose, Bld: 102 mg/dL — ABNORMAL HIGH (ref 65–99)
HEMATOCRIT: 42 % (ref 39.0–52.0)
Hemoglobin: 14.3 g/dL (ref 13.0–17.0)
Potassium: 4.1 mmol/L (ref 3.5–5.1)
Sodium: 133 mmol/L — ABNORMAL LOW (ref 135–145)
TCO2: 23 mmol/L (ref 0–100)

## 2015-10-26 LAB — URINALYSIS, ROUTINE W REFLEX MICROSCOPIC
Bilirubin Urine: NEGATIVE
GLUCOSE, UA: NEGATIVE mg/dL
Hgb urine dipstick: NEGATIVE
Ketones, ur: NEGATIVE mg/dL
LEUKOCYTES UA: NEGATIVE
Nitrite: NEGATIVE
PH: 6.5 (ref 5.0–8.0)
PROTEIN: NEGATIVE mg/dL
SPECIFIC GRAVITY, URINE: 1.015 (ref 1.005–1.030)

## 2015-10-26 LAB — CSF CELL COUNT WITH DIFFERENTIAL
LYMPHS CSF: 16 % — AB (ref 40–80)
LYMPHS CSF: 24 % — AB (ref 40–80)
MONOCYTE-MACROPHAGE-SPINAL FLUID: 13 % — AB (ref 15–45)
MONOCYTE-MACROPHAGE-SPINAL FLUID: 19 % (ref 15–45)
RBC COUNT CSF: 0 /mm3
RBC Count, CSF: 1 /mm3 — ABNORMAL HIGH
SEGMENTED NEUTROPHILS-CSF: 57 % — AB (ref 0–6)
Segmented Neutrophils-CSF: 71 % — ABNORMAL HIGH (ref 0–6)
TUBE #: 1
Tube #: 4
WBC CSF: 17 /mm3 — AB (ref 0–5)
WBC, CSF: 14 /mm3 (ref 0–5)

## 2015-10-26 LAB — CREATININE, SERUM
Creatinine, Ser: 0.93 mg/dL (ref 0.61–1.24)
GFR calc non Af Amer: >60 mL/min (ref >60)

## 2015-10-26 LAB — GLUCOSE, CSF: Glucose, CSF: 60 mg/dL (ref 40–70)

## 2015-10-26 LAB — PROTEIN, CSF: Total  Protein, CSF: 60 mg/dL — ABNORMAL HIGH (ref 15–45)

## 2015-10-26 LAB — I-STAT CG4 LACTIC ACID, ED: Lactic Acid, Venous: 0.63 mmol/L (ref 0.5–1.9)

## 2015-10-26 IMAGING — CR DG CHEST 2V
2 series · 2 of 2 positions shown · non-contrast
Comparison: Chest radiograph [DATE]

CLINICAL DATA: Fever beginning [DATE]st, headache and body aches
tonight. Recent tick bite.

EXAM:
CHEST  2 VIEW

[w chest lat]
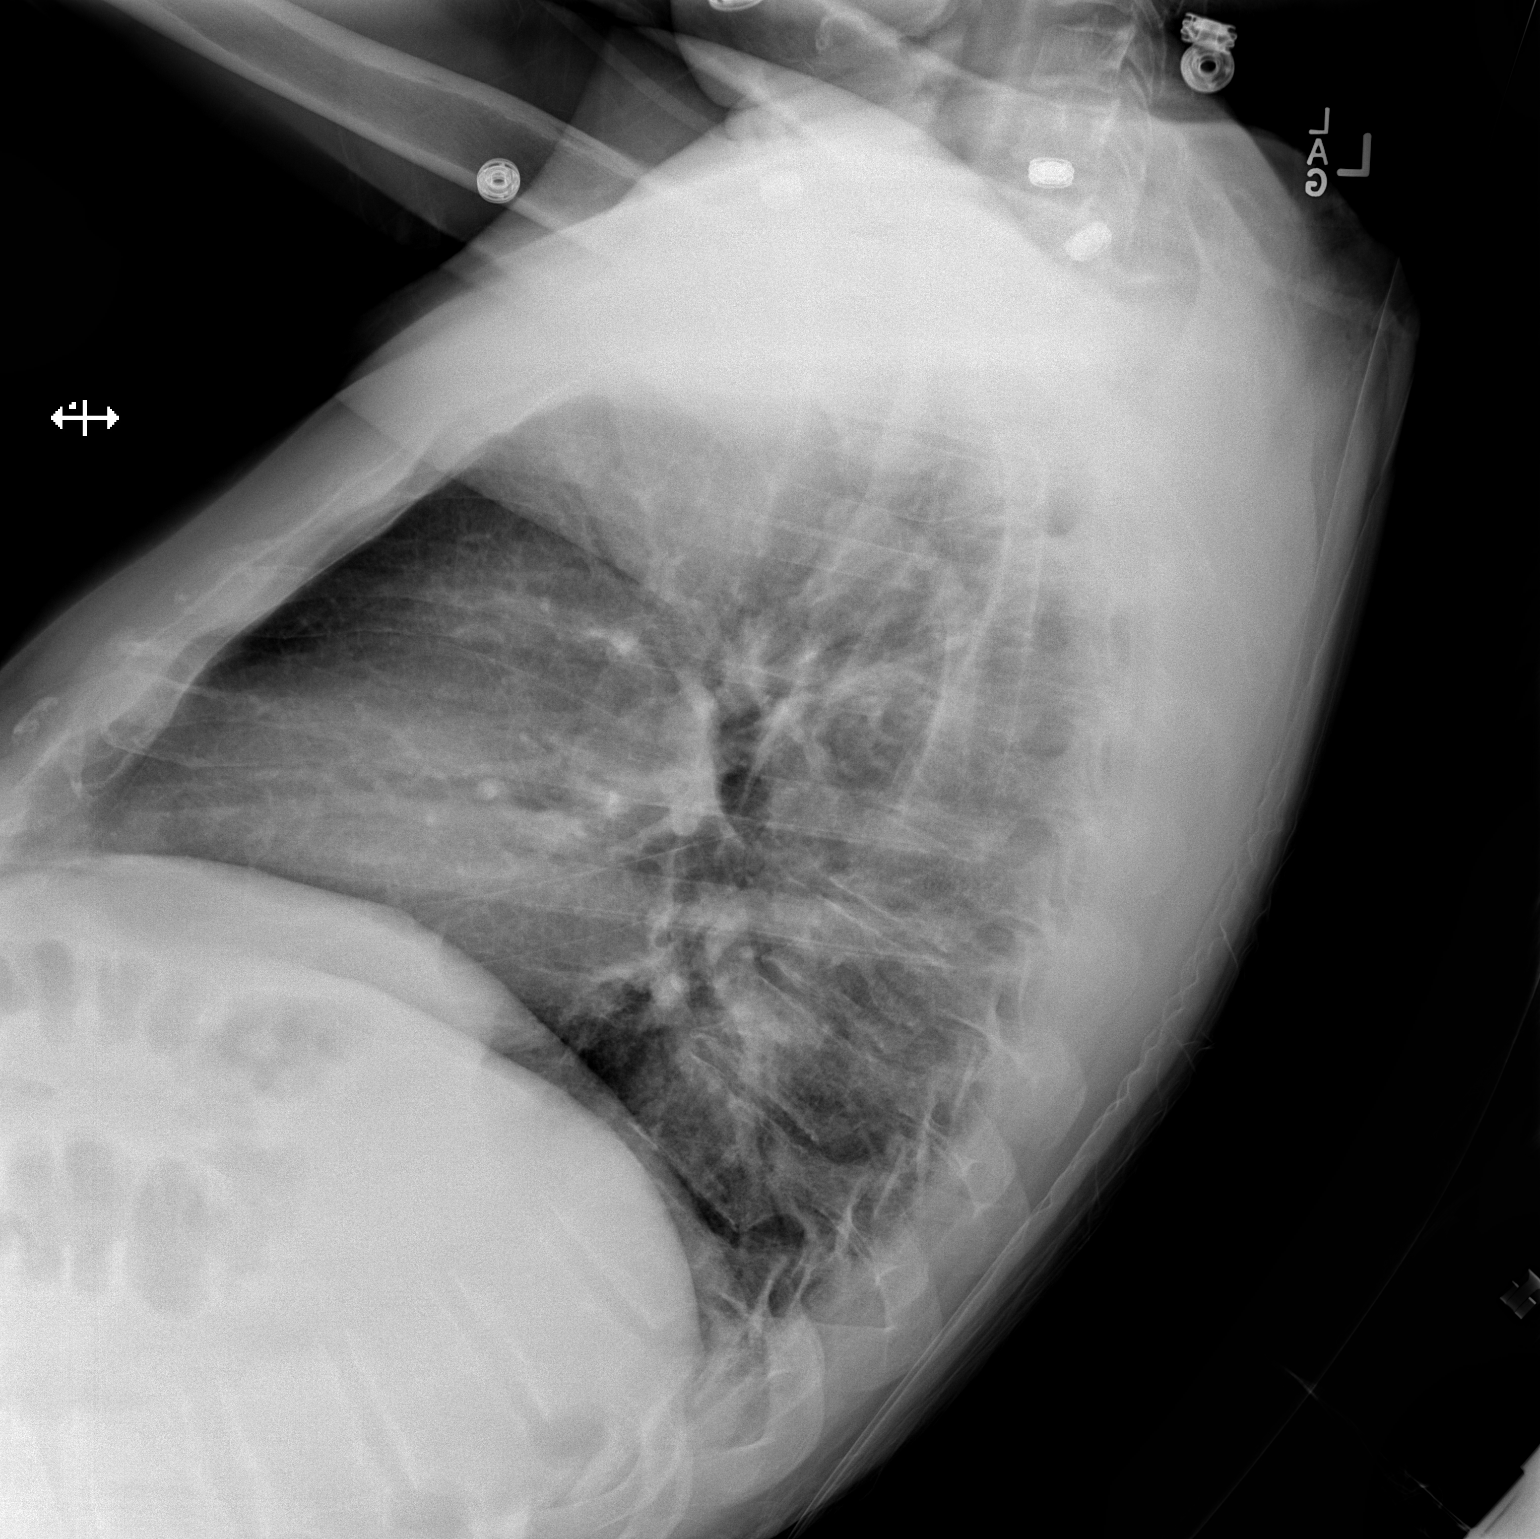

[x chest ap]
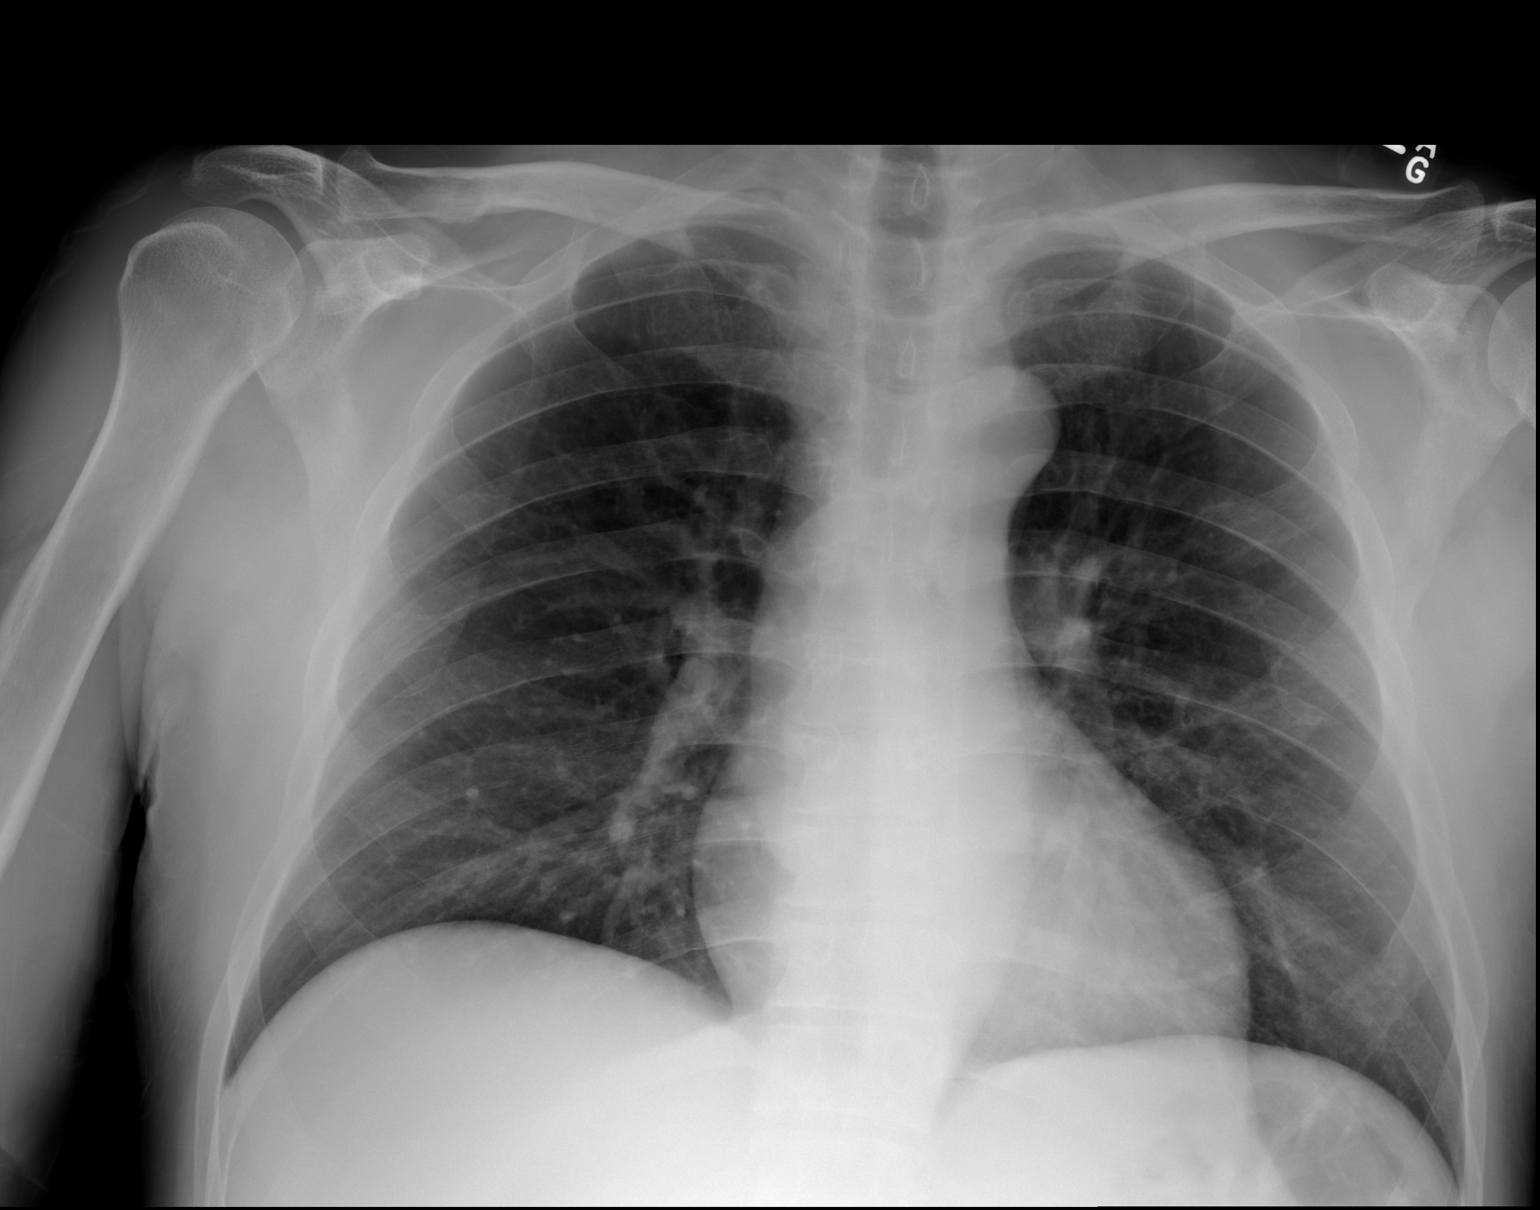

[2 of 2 positions shown; findings below may reference images not displayed]

FINDINGS: Cardiomediastinal silhouette is normal. The lungs are clear without
pleural effusions or focal consolidations. Trachea projects midline
and there is no pneumothorax. Soft tissue planes and included
osseous structures are non-suspicious.
IMPRESSION: Normal chest.

## 2015-10-26 MED ORDER — DEXTROSE 5 % IV SOLN
2.0000 g | Freq: Once | INTRAVENOUS | Status: AC
  Administered 2015-10-26: 2 g via INTRAVENOUS
  Filled 2015-10-26: qty 2

## 2015-10-26 MED ORDER — METOCLOPRAMIDE HCL 5 MG/ML IJ SOLN
10.0000 mg | Freq: Once | INTRAMUSCULAR | Status: AC
Start: 2015-10-26 — End: 2015-10-26
  Administered 2015-10-26: 10 mg via INTRAVENOUS
  Filled 2015-10-26: qty 2

## 2015-10-26 MED ORDER — DIPHENHYDRAMINE HCL 50 MG/ML IJ SOLN
25.0000 mg | Freq: Once | INTRAMUSCULAR | Status: AC
  Administered 2015-10-26: 25 mg via INTRAVENOUS
  Filled 2015-10-26: qty 1

## 2015-10-26 MED ORDER — OXYCODONE HCL 5 MG PO TABS
5.0000 mg | ORAL_TABLET | ORAL | Status: DC | PRN
  Administered 2015-10-26: 5 mg via ORAL
  Filled 2015-10-26: qty 1

## 2015-10-26 MED ORDER — MORPHINE SULFATE (PF) 4 MG/ML IV SOLN
4.0000 mg | Freq: Once | INTRAVENOUS | Status: AC
  Administered 2015-10-26: 4 mg via INTRAVENOUS
  Filled 2015-10-26: qty 1

## 2015-10-26 MED ORDER — SODIUM CHLORIDE 0.9 % IV SOLN
INTRAVENOUS | Status: DC
  Administered 2015-10-27 – 2015-10-28 (×3): via INTRAVENOUS

## 2015-10-26 MED ORDER — IBUPROFEN 800 MG PO TABS: 800.0000 mg | ORAL_TABLET | Freq: Three times a day (TID) | ORAL | 0 refills | Status: DC

## 2015-10-26 MED ORDER — ENOXAPARIN SODIUM 40 MG/0.4ML ~~LOC~~ SOLN
40.0000 mg | SUBCUTANEOUS | Status: DC
  Administered 2015-10-26 – 2015-10-29 (×4): 40 mg via SUBCUTANEOUS
  Filled 2015-10-26 (×4): qty 0.4

## 2015-10-26 MED ORDER — DOXYCYCLINE HYCLATE 100 MG PO TABS
100.0000 mg | ORAL_TABLET | Freq: Two times a day (BID) | ORAL | Status: DC
  Administered 2015-10-26 – 2015-10-30 (×8): 100 mg via ORAL
  Filled 2015-10-26 (×8): qty 1

## 2015-10-26 MED ORDER — SODIUM CHLORIDE 0.9 % IV BOLUS (SEPSIS)
1000.0000 mL | Freq: Once | INTRAVENOUS | Status: AC
  Administered 2015-10-26: 1000 mL via INTRAVENOUS

## 2015-10-26 MED ORDER — DOXYCYCLINE HYCLATE 100 MG PO CAPS: 100.0000 mg | ORAL_CAPSULE | Freq: Two times a day (BID) | ORAL | 0 refills | Status: DC

## 2015-10-26 MED ORDER — OXYCODONE HCL 5 MG PO TABS
5.0000 mg | ORAL_TABLET | Freq: Four times a day (QID) | ORAL | Status: DC | PRN
  Administered 2015-10-27 – 2015-10-29 (×4): 5 mg via ORAL
  Filled 2015-10-26 (×4): qty 1

## 2015-10-26 MED ORDER — ONDANSETRON HCL 4 MG/2ML IJ SOLN
4.0000 mg | Freq: Once | INTRAMUSCULAR | Status: AC
  Administered 2015-10-26: 4 mg via INTRAVENOUS
  Filled 2015-10-26: qty 2

## 2015-10-26 MED ORDER — KETOROLAC TROMETHAMINE 30 MG/ML IJ SOLN
30.0000 mg | Freq: Once | INTRAMUSCULAR | Status: AC
  Administered 2015-10-26: 30 mg via INTRAVENOUS
  Filled 2015-10-26: qty 1

## 2015-10-26 MED ORDER — DEXTROSE 5 % IV SOLN
2.0000 g | Freq: Two times a day (BID) | INTRAVENOUS | Status: DC
  Administered 2015-10-27 – 2015-10-28 (×3): 2 g via INTRAVENOUS
  Filled 2015-10-26 (×4): qty 2

## 2015-10-26 MED ORDER — SODIUM CHLORIDE 0.9 % IV SOLN
INTRAVENOUS | Status: AC
  Administered 2015-10-26: 17:00:00 via INTRAVENOUS

## 2015-10-26 MED ORDER — ACETAMINOPHEN 500 MG PO TABS
500.0000 mg | ORAL_TABLET | Freq: Once | ORAL | Status: AC
  Administered 2015-10-26: 500 mg via ORAL
  Filled 2015-10-26 (×2): qty 1

## 2015-10-26 MED ORDER — SODIUM CHLORIDE 0.9 % IV SOLN: INTRAVENOUS | Status: DC

## 2015-10-26 NOTE — ED Notes (Signed)
Dr. Anitra Lauth made aware of Dr. Idelle Crouch contact info [phone] in case of any questions.

## 2015-10-26 NOTE — Telephone Encounter (Signed)
Called pts wife back, she stated they were able to get in to see Dr. Nehemiah Settle today.

## 2015-10-26 NOTE — ED Provider Notes (Signed)
[facility]-EMERGENCY DEPT Provider Note   CSN: [id number] Arrival date & time: 10/26/15  1036  First Provider Contact:  10/26/15 1130    History   Chief Complaint Chief Complaint  Patient presents with  . Fever    HPI Alexander Gross is a 55 y.o. male with no significant PMH presenting for fever and chills.  HPI  Fever began four days ago. He was seen by his PCP (Dr. Nehemiah Settle at [place]) two days ago for primary complaint of HA accompanying fever, at which time he was prescribed Augmentin for a suspected sinus infection. CT head performed at that time was negative for acute abnormalities.    His symptoms did not improve and fever increased to max of 102.55F, so patient presented to [facility] ED last night. CXR at that time WNL. Symptoms were thought to be 2/2 viral illness, but antibiotic coverage was transitioned to doxycycline to cover for tick-borne illness. As patient had no signs of meningitis at that time, LP was not performed. He was discharged home with instructions to f/u with PCP.   Patient's fever increased to 103.29F on morning of presentation to [facility] ED, so he called his PCP, who instructed him to come to [facility] ED for further work-up and potentially LP. In ED, patient reporting severe HA, chills, and general malaise. Reports HA is located above his eyes and most prominent on the R. Fever exacerbated by light and sudden movement, especially coughing. Patient reports episodes of alternating rigors and diaphoresis overnight. His wife states that he has had vomiting for the past 4-5 days, and has had minimal to no PO intake for the past 4 days. Patient endorsing nausea this AM but has not vomited in about a day.   Patient was bitten by multiple ticks about two months ago when hiking in [place]. He developed a rash near the site of one of the bites, but was told by his dermatologist that the rash was more likely a manifestation of his rosacea. Patient has also been working in his backyard recently in a [employer]. His wife is concerned that he may have received another tick bite, as there are a lot of deer in their backyard and she wonders about the presence of deer ticks.   History reviewed. No pertinent past medical history.  There are no active problems to display for this patient.  History reviewed. No pertinent surgical history.   Home Medications    Prior to Admission medications   Medication Sig Start Date End Date Taking? Authorizing Provider  acetaminophen (TYLENOL) 500 MG tablet Take 1,000 mg by mouth every 6 (six) hours as needed for mild pain.    Historical Provider, MD  amoxicillin-clavulanate (AUGMENTIN) 875-125 MG tablet Take 1 tablet by mouth 2 (two) times daily. 10/24/15   Morrell Riddle, PA-C  doxycycline (VIBRAMYCIN) 100 MG capsule Take 1 capsule (100 mg total) by mouth 2 (two) times daily. One po bid x 7 days 10/26/15   April Palumbo, MD  Guaifenesin [facility] MAXIMUM STRENGTH) 1200 MG TB12 Take 1 tablet (1,200 mg total) by mouth 2 (two) times daily. 10/24/15 10/31/15  Morrell Riddle, PA-C  HYDROcodone-acetaminophen (NORCO/VICODIN) 5-325 MG tablet Take 1 tablet by mouth every 6 (six) hours as needed for moderate pain. 10/24/15   Morrell Riddle, PA-C  ibuprofen (ADVIL,MOTRIN) 800 MG tablet Take 1 tablet (800 mg total) by mouth 3 (three) times daily. 10/26/15   April Palumbo, MD  loratadine (CLARITIN) 10 MG tablet Take 10 mg  by mouth daily.    Historical Provider, MD    Family History No family history on file.  Social History Social History  Substance Use Topics  . Smoking status: Never Smoker  . Smokeless tobacco: Current User  . Alcohol use 3.6 oz/week    6 Cans of beer per week     Allergies   Review of patient's allergies indicates no known allergies.   Review of Systems Review of Systems  Constitutional: Positive for activity change, appetite change, chills, diaphoresis, fatigue and fever.  Eyes: Positive for photophobia. Negative for visual disturbance.    Respiratory: Positive for cough. Negative for shortness of breath.   Cardiovascular: Negative for chest pain, palpitations and leg swelling.  Gastrointestinal: Positive for nausea and vomiting. Negative for abdominal pain, constipation and diarrhea.  Endocrine: Positive for cold intolerance.  Musculoskeletal: Positive for arthralgias and myalgias.  Skin: Negative for rash.  Allergic/Immunologic: Negative for immunocompromised state.  Neurological: Positive for headaches. Negative for dizziness, weakness and light-headedness.   Physical Exam Updated Vital Signs BP 118/84   Pulse 60   Temp (S) 99.4 F (37.4 C) (Oral)   SpO2 99%   Physical Exam  Constitutional: He is oriented to person, place, and time.  Lying in bed under multiple blankets with lights off in room; rigors present but no diaphoresis. Appears uncomfortable.   HENT:  Head: Normocephalic and atraumatic.  Right Ear: External ear normal.  Left Ear: External ear normal.  Nose: Nose normal.  Mouth/Throat: Oropharynx is clear and moist. No oropharyngeal exudate.  Eyes: Conjunctivae and EOM are normal. Right eye exhibits no discharge. Left eye exhibits no discharge.  Pupils small but reactive   Neck: Normal range of motion. Neck supple.  No nuchal rigidity  Cardiovascular: Normal rate, regular rhythm, normal heart sounds and intact distal pulses.   No murmur heard. Pulmonary/Chest: Effort normal and breath sounds normal. No respiratory distress. He has no wheezes. He has no rales. He exhibits no tenderness.  Abdominal: Soft. Bowel sounds are normal. He exhibits no distension. There is no tenderness. There is no rebound and no guarding.  Musculoskeletal: Normal range of motion. He exhibits no edema or tenderness.  Lymphadenopathy:    He has no cervical adenopathy.  Neurological: He is alert and oriented to person, place, and time. No cranial nerve deficit.  CN II-XII intact  Skin: Skin is warm and dry. No rash noted.   Psychiatric: He has a normal mood and affect. His behavior is normal.  Nursing note and vitals reviewed.  ED Treatments / Results  Labs (all labs ordered are listed, but only abnormal results are displayed) Labs Reviewed  CSF CELL COUNT WITH DIFFERENTIAL - Abnormal; Notable for the following:       Result Value   WBC, CSF 17 (*)    Segmented Neutrophils-CSF 71 (*)    Lymphs, CSF 16 (*)    Monocyte-Macrophage-Spinal Fluid 13 (*)    All other components within normal limits  CSF CELL COUNT WITH DIFFERENTIAL - Abnormal; Notable for the following:    RBC Count, CSF 1 (*)    WBC, CSF 14 (*)    Segmented Neutrophils-CSF 57 (*)    Lymphs, CSF 24 (*)    All other components within normal limits  PROTEIN, CSF - Abnormal; Notable for the following:    Total  Protein, CSF 60 (*)    All other components within normal limits  I-STAT CHEM 8, ED - Abnormal; Notable for the following:  Sodium 133 (*)    Chloride 99 (*)    Glucose, Bld 102 (*)    Calcium, Ion 1.01 (*)    All other components within normal limits  CSF CULTURE  URINE CULTURE  CBC  GLUCOSE, CSF  URINALYSIS, ROUTINE W REFLEX MICROSCOPIC (NOT AT [facility])    EKG  EKG Interpretation None       Radiology Dg Chest 2 View  Result Date: 10/26/2015 CLINICAL DATA:  Fever beginning July 31st, headache and body aches tonight. Recent tick bite. EXAM: CHEST  2 VIEW COMPARISON:  Chest radiograph May 11, 2010 FINDINGS: Cardiomediastinal silhouette is normal. The lungs are clear without pleural effusions or focal consolidations. Trachea projects midline and there is no pneumothorax. Soft tissue planes and included osseous structures are non-suspicious. IMPRESSION: Normal chest. Electronically Signed   By: Awilda Metro M.D.   On: 10/26/2015 00:35    Procedures Procedures (including critical care time)  Medications Ordered in ED Medications  doxycycline (VIBRA-TABS) tablet 100 mg (not administered)  cefTRIAXone (ROCEPHIN) 2  g in dextrose 5 % 50 mL IVPB (not administered)  diphenhydrAMINE (BENADRYL) injection 25 mg (25 mg Intravenous Given 10/26/15 1153)  ketorolac (TORADOL) 30 MG/ML injection 30 mg (30 mg Intravenous Given 10/26/15 1153)  metoCLOPramide (REGLAN) injection 10 mg (10 mg Intravenous Given 10/26/15 1153)  sodium chloride 0.9 % bolus 1,000 mL (1,000 mLs Intravenous New Bag/Given 10/26/15 1427)  morphine 4 MG/ML injection 4 mg (4 mg Intravenous Given 10/26/15 1426)     Initial Impression / Assessment and Plan / ED Course  I have reviewed the triage vital signs and the nursing notes.  Pertinent labs & imaging results that were available during my care of the patient were reviewed by me and considered in my medical decision making (see chart for details).  Clinical Course    55 yo M presenting with fever (Tmax 103.83F) with concern for tick-borne illness vs meningitis. Given migraine cocktail for HA, as this improved patient's pain at [facility] ED yesterday. Symptoms persistent despite Augmentin and doxycycline, only received Augmentin for one day and did not receive any doses of doxycycline prior to return to ED today. Known tick bites occurred about two months ago with no confirmed subsequent rash, however potential for more recent tick bites given ample time spent outdoors in a [employer]. CT head performed two days ago normal, so will not repeat scan at this time. Will perform LP. Patient's HA improved with migraine cocktail and is now resting comfortably.    1401 LP performed. Patient's HA has returned. Will give 1L bolus and IV morphine 4mg .   Final Clinical Impressions(s) / ED Diagnoses   Final diagnoses:  None    55 yo M presenting with fever and HA concerning for viral meningitis vs RMSF. LP with elevated protein, neutrophils and 17 WBC, no organisms seen on gram stain. CSF culture pending. Perhaps representative of partially treated meningitis given two days of completed Augmentin therapy. Will start  doxycycline and give 2g CTX given concern for tick exposure and presence of neutrophils. Spoke with family medicine inpatient team, will admit.   New Prescriptions New Prescriptions   No medications on file     Marquette Saa, MD 10/26/15 1552    Gwyneth Sprout, MD 10/27/15 2044

## 2015-10-26 NOTE — ED Notes (Signed)
MD at bedside. 

## 2015-10-26 NOTE — ED Notes (Signed)
Dr. Kennon Rounds at bedside

## 2015-10-26 NOTE — ED Notes (Signed)
Dr. Anitra Lauth at bedside to perform lumbar puncture

## 2015-10-26 NOTE — ED Notes (Signed)
pts wife at bedside reports pt found a few ticks on him about a month ago that he removed but then patient discovered a rash on his body soon. pts wife also reports they have been treating patient's fever with tylenol and motrin but patient continues to be febrile. pts wife reports pt had some vomiting Saturday but has not had any vomiting since.

## 2015-10-26 NOTE — ED Notes (Signed)
Pt ambulatory and independent at discharge.  Pt and his wife verbalized understanding of all discharge instructions.

## 2015-10-26 NOTE — H&P (Signed)
Family Medicine Teaching [facility] Admission History and Physical Service Pager: [phone]  Patient name: Alexander Gross Medical record number: [id number] Date of birth: September 14, 1960 Age: 55 y.o. Gender: male  Primary Care Provider: No PCP Per Patient Consultants: None Code Status: FULL  Chief Complaint:  Headache and fever   Assessment and Plan:  Alexander Gross is a 55yo male presenting with progressively worsening headache, neck stiffness  and fever to 103.4, found to have elevated protein and WBC's on LP.   PMHx positive for reported chronic sinusitis on claritin.  Headache/Photophobia-  History of headache with photophobia, fever and CSF studies consistent with neurologic infectious process most concerning for meningitis. Concern for viral vs bacterial vs Tick-born etiology.  LP positive for elevated CSF protein (60 mg/dL), WBC 79/KW mm , 1 RBC,  71% segmented neutrophils. More mild CSF protein elevation consistent with viral vs tick born illness (Tunkel A Aseptic meningitis in adults. In UpToDate). However, Bacterial meningitis still on the differential but less likely with normal CSF glucose, and lack of sick contacts. Given his history of recent possible tick exposure when doing yard work, there is high concern that his symptoms are secondary to a tick born illness. Signifiantly had a CT head which was negative for acute pathology.  - admit to [facility] medicine Dr Randolm Idol - follow up B burgdorfi antibodies, [place] mt spotted fever Abs, CSF culture, UCx, blood culture - Doxycyline 100 BID for concern for Lyme vs RMSF - Rosephin per pharmacy given concern for Neurologic component of tick borne illness versus Meningitis from other bacterial cause - Tylenol PRN fever - tylenol, oxycodone PRN pain - given unknown cause of meningitis, will keep on precautions for now   Chronic sinusitis - per history.  Patient did have Sinus XR on 7/31 positive for retention cyst or polyp inferiorly in  right maxillary sinus, no fluid levels observed - restart home claritin and guaifenesin if nasal drainage becomes an issues  FEN/GI: Clear fluid diet, 184mL/hr 0.9% normal saline Prophylaxis: lovenox  Disposition: med/surg admit to Dr. Randolm Idol with Family Practice Teaching Service  History of Present Illness:  Alexander Gross is a 55 y.o. male presenting with progressively worsening headache and fever of 4 days duration. The patient endorses mild headache starting Saturday evening (7/30) which progressively worsened over the next several days.  He went to see his PCP (Dr. Nehemiah Gross at [place]) two days ago and was prescribed Augmentin for suspected sinus infection.  CT head was performed with results negative for acute abnormalities. Symptoms worsened over next two days with max fever 103.17F, prompting him to go to [facility] ED (7/31) where CXR was normal and symptoms were thought to be due to viral illness; patient without symptoms of meningitis at that time.  Antibiotic coverage was transitioned to doxycycline to cover for tick-borne illness and he was DC'd to follow up with PCP.   Patient's fever worsened to 103.84F this morning and so patient called PCP who recommended he come in to [facility] ED for LP and meningitis evaluation.  On presentation today he endorses  photophobia, fatigue, headache worse with movement, and neck stiffness not palliated with tylenol.  He reports nausea with 3 episodes of non-bloody emesis in the past 4 days.  Has not noticed a rash.  Patient does endorse work in the back yard/woods clearing bamboo thicket in an area with many deer prior in the two days leading up to symptom onset.  Denies noticing any bug bites/tick bites/seeing ticks  on him. Denies leakage of clear fluid from nose.  Denies any recent travel or sick contacts.  Of note, he did extensive yard work 2 weeks ago clearing bamboo. He does report that deer frequent his yard and his wife found a tick on her while helping  him clear the yard. However, he does not remember finding a tick on him nor developing any rashes  Review Of Systems: Per HPI else denies, SOB chest pain, diarrhea, abdominal pain Otherwise the remainder of the systems were negative.  Patient Active Problem List   Diagnosis Date Noted  . Meningitis 10/26/2015    Past Medical History: - Chronic sinusitis per patient history  Past Surgical History: History reviewed. No pertinent surgical history.  Social History: Social History  Substance Use Topics  . Smoking status: Never Smoker  . Smokeless tobacco: Current User  . Alcohol use 3.6 oz/week    6 Cans of beer per week    Please also refer to relevant sections of EMR.  Family History: No pertinent family history  Allergies and Medications: No Known Allergies No current facility-administered medications on file prior to encounter.    Current Outpatient Prescriptions on File Prior to Encounter  Medication Sig Dispense Refill  . acetaminophen (TYLENOL) 500 MG tablet Take 1,000 mg by mouth every 4 (four) hours as needed for fever.     Marland Kitchen amoxicillin-clavulanate (AUGMENTIN) 875-125 MG tablet Take 1 tablet by mouth 2 (two) times daily. 20 tablet 0  . doxycycline (VIBRAMYCIN) 100 MG capsule Take 1 capsule (100 mg total) by mouth 2 (two) times daily. One po bid x 7 days (Patient taking differently: Take 100 mg by mouth 2 (two) times daily. One po bid x 7 days, for rosacea) 14 capsule 0  . Guaifenesin (MUCINEX MAXIMUM STRENGTH) 1200 MG TB12 Take 1 tablet (1,200 mg total) by mouth 2 (two) times daily. 14 each 0  . HYDROcodone-acetaminophen (NORCO/VICODIN) 5-325 MG tablet Take 1 tablet by mouth every 6 (six) hours as needed for moderate pain. 20 tablet 0  . ibuprofen (ADVIL,MOTRIN) 800 MG tablet Take 1 tablet (800 mg total) by mouth 3 (three) times daily. 21 tablet 0  . loratadine (CLARITIN) 10 MG tablet Take 10 mg by mouth daily.      Objective: BP 129/85   Pulse 67   Temp (S) 99.4  F (37.4 C) (Oral)   SpO2 98%  Exam: General: well-nourished male lies in bed, appears uncomfortable Eyes: 1cm constricted, pupils equally round and reactive, +photophobia ENTM: mucous membranes moist, no pharyngeal erythema or exudate Neck: (-) nuchal rigidity,(-) Kernig Sign Cardiovascular: RRR, no murmurs, rubs or gallops     Respiratory: clear to auscultation bilaterally, no wheezes, rhonchi or rales Abdomen: soft, nontender, nondistended MSK:  Full ROMx4  Skin: no rashes or focal lesions Neuro: no focal weakness, CNII-XII grossly intact Psych:  AAOx3, no confusion or altered mental status   Labs and Imaging: CBC BMET   Recent Labs Lab 10/26/15 1136 10/26/15 1218  WBC 8.2  --   HGB 14.3 14.3  HCT 41.3 42.0  PLT 171  --     Recent Labs Lab 10/26/15 1218  NA 133*  K 4.1  CL 99*  BUN 7  CREATININE 0.90  GLUCOSE 102*      Howard Pouch, MD 10/26/2015, 5:21 PM PGY-1, [facility] FPTS Intern pager: [phone], text pages welcome  I have seen and examined the patient. I have read and agree with the above note. My changes  are noted in blue.  Janneth Krasner A. Kennon Rounds MD, MS Family Medicine Resident PGY-3 Pager [phone]

## 2015-10-26 NOTE — Progress Notes (Signed)
Pharmacy Antibiotic Note  Alexander Gross is a 55 y.o. male admitted on 10/26/2015 with chief complaint of headache and fever. Headache with photophobia, fever and CSF studies consistent with neurologic infectious process most concerning for meningitis. Elevated WBC on CSF with elevated protein, awaiting CSF culture result. Pharmacy has been consulted for Ceftriaxone dosing to treat empirically for possible bacterial meningitis.   Tc 100 F,  Tm 103.1,  WBC 8.2k,  Lactic Acid 0.91>0.63 SCr 0.9, CrCl ~ 91.8 ml/min The patient received 1st dose of Ceftriaxone 2 g IV x1 @ 17:04 today.   Plan: Ceftriaxone 2 g IV q12h for meningitis coverage.  Monitor clinical status and culture results daily.      Temp (24hrs), Avg:100.4 F (38 C), Min:99.1 F (37.3 C), Max:103.1 F (39.5 C)   Recent Labs Lab 10/24/15 1032 10/25/15 2317 10/25/15 2337 10/26/15 0209 10/26/15 1136 10/26/15 1218  WBC 6.9 10.1  --   --  8.2  --   CREATININE  --   --  0.90  --   --  0.90  LATICACIDVEN  --   --  0.91 0.63  --   --     Estimated Creatinine Clearance: 91.8 mL/min (by C-G formula based on SCr of 0.9 mg/dL).    No Known Allergies  Antimicrobials this admission: Ceftriaxone 8/2>  PO Doxycycline 8/2>>     Dose adjustments this admission: Not at this time Ceftriaxone does not require renal dose adjustments  Microbiology results: 8/2 CSF Cx: pending 8/2 UCx: sent    Thank you for allowing pharmacy to be a part of this patient's care. Noah Delaine, RPh Clinical Pharmacist Pager: [phone] 10/26/2015 8:53 PM

## 2015-10-26 NOTE — Telephone Encounter (Signed)
PATIENTS WIFE CALLED TO SAY HER HUSBAND SAW SARAH WEBER 2 DAYS AGO FOR HEADACHE, FEVER AND VOMITING. LAST NIGHT HIS FEVER GOT UP TO 102.7 AND SHE TOOK HIM TO THE EMERGENCY ROOM. HE HAS ALSO HAD A CAT SCAN DONE. THE EMERGENCY ROOM DOCTOR TOLD HIM TO GO TO HIS PCP AND ASK THEM TO DO A LYME DISEASE TEST ON HIM. HIS DOCTOR IS DR. POLITE AND SHE HAS NOT BEEN ABLE TO CONTACT HIS OFFICE YET. SHE WOULD LIKE TO KNOW IF SHE CAN BRING HIM HERE TO HAVE THE TEST DONE TODAY IF DR. POLITE'S OFFICE CAN NOT GET HIM IN? BEST PHONE [phone] (WIFE'S NAME IS VIRGINIA Shimada AND THIS IS HER CELL PHONE) MBC

## 2015-10-26 NOTE — ED Triage Notes (Signed)
Pt here with fevers since Saturday, today 101.7. Coming from PCP with concern for meningitis needing spinal tap. Pt is alert, c/o headache.

## 2015-10-26 NOTE — ED Notes (Signed)
Pt given urinal.  Explained the need for an in & out cath if he is unable to go.

## 2015-10-27 ENCOUNTER — Encounter (HOSPITAL_COMMUNITY): Payer: Self-pay | Admitting: General Practice

## 2015-10-27 LAB — B. BURGDORFI ANTIBODIES

## 2015-10-27 LAB — BASIC METABOLIC PANEL
Anion gap: 9 (ref 5–15)
BUN: 8 mg/dL (ref 6–20)
CHLORIDE: 102 mmol/L (ref 101–111)
CO2: 21 mmol/L — AB (ref 22–32)
Calcium: 8.3 mg/dL — ABNORMAL LOW (ref 8.9–10.3)
Creatinine, Ser: 0.81 mg/dL (ref 0.61–1.24)
GFR calc Af Amer: >60 mL/min (ref >60)
GFR calc non Af Amer: >60 mL/min (ref >60)
GLUCOSE: 98 mg/dL (ref 65–99)
POTASSIUM: 4.4 mmol/L (ref 3.5–5.1)
SODIUM: 132 mmol/L — AB (ref 135–145)

## 2015-10-27 LAB — CBC
HEMATOCRIT: 35.7 % — AB (ref 39.0–52.0)
Hemoglobin: 12.3 g/dL — ABNORMAL LOW (ref 13.0–17.0)
MCH: 32.2 pg (ref 26.0–34.0)
MCHC: 34.5 g/dL (ref 30.0–36.0)
MCV: 93.5 fL (ref 78.0–100.0)
Platelets: 147 10*3/uL — ABNORMAL LOW (ref 150–400)
RBC: 3.82 MIL/uL — ABNORMAL LOW (ref 4.22–5.81)
RDW: 11.8 % (ref 11.5–15.5)
WBC: 7.9 10*3/uL (ref 4.0–10.5)

## 2015-10-27 LAB — PATHOLOGIST SMEAR REVIEW

## 2015-10-27 LAB — HIV ANTIBODY (ROUTINE TESTING W REFLEX): HIV Screen 4th Generation wRfx: NONREACTIVE

## 2015-10-27 LAB — HERPES SIMPLEX VIRUS(HSV) DNA BY PCR
HSV 1 DNA: NEGATIVE
HSV 2 DNA: NEGATIVE

## 2015-10-27 MED ORDER — VANCOMYCIN HCL IN DEXTROSE 1-5 GM/200ML-% IV SOLN
1000.0000 mg | Freq: Three times a day (TID) | INTRAVENOUS | Status: DC
  Administered 2015-10-27 – 2015-10-28 (×4): 1000 mg via INTRAVENOUS
  Filled 2015-10-27 (×5): qty 200

## 2015-10-27 MED ORDER — MORPHINE SULFATE (PF) 2 MG/ML IV SOLN
2.0000 mg | INTRAVENOUS | Status: DC | PRN
  Administered 2015-10-27 – 2015-10-29 (×8): 2 mg via INTRAVENOUS
  Filled 2015-10-27 (×8): qty 1

## 2015-10-27 MED ORDER — ENSURE ENLIVE PO LIQD
237.0000 mL | Freq: Two times a day (BID) | ORAL | Status: DC
  Administered 2015-10-27: 237 mL via ORAL

## 2015-10-27 MED ORDER — KETOROLAC TROMETHAMINE 30 MG/ML IJ SOLN
30.0000 mg | Freq: Three times a day (TID) | INTRAMUSCULAR | Status: DC | PRN
  Administered 2015-10-27 – 2015-10-28 (×5): 30 mg via INTRAVENOUS
  Filled 2015-10-27 (×5): qty 1

## 2015-10-27 MED ORDER — AMPICILLIN SODIUM 2 G IJ SOLR
2.0000 g | INTRAMUSCULAR | Status: DC
  Administered 2015-10-27 – 2015-10-28 (×7): 2 g via INTRAVENOUS
  Filled 2015-10-27 (×9): qty 2000

## 2015-10-27 MED ORDER — METOCLOPRAMIDE HCL 5 MG/ML IJ SOLN
10.0000 mg | Freq: Three times a day (TID) | INTRAMUSCULAR | Status: DC | PRN
  Administered 2015-10-27 – 2015-10-28 (×3): 10 mg via INTRAVENOUS
  Filled 2015-10-27 (×4): qty 2

## 2015-10-27 NOTE — ED Provider Notes (Signed)
[facility]-EMERGENCY DEPT Provider Note   CSN: [id number] Arrival date & time: 10/26/15  1036  First Provider Contact:  First MD Initiated Contact with Patient 10/26/15 1130        History   Chief Complaint Chief Complaint  Patient presents with  . Fever    HPI Alexander Gross is a 55 y.o. male.  HPI  History reviewed. No pertinent past medical history.  Patient Active Problem List   Diagnosis Date Noted  . Meningitis 10/26/2015    History reviewed. No pertinent surgical history.     Home Medications    Prior to Admission medications   Medication Sig Start Date End Date Taking? Authorizing Provider  acetaminophen (TYLENOL) 500 MG tablet Take 1,000 mg by mouth every 4 (four) hours as needed for fever.    Yes Historical Provider, MD  amoxicillin-clavulanate (AUGMENTIN) 875-125 MG tablet Take 1 tablet by mouth 2 (two) times daily. 10/24/15  Yes Morrell Riddle, PA-C  doxycycline (VIBRAMYCIN) 100 MG capsule Take 1 capsule (100 mg total) by mouth 2 (two) times daily. One po bid x 7 days Patient taking differently: Take 100 mg by mouth 2 (two) times daily. One po bid x 7 days, for rosacea 10/26/15  Yes April Palumbo, MD  Guaifenesin [facility] MAXIMUM STRENGTH) 1200 MG TB12 Take 1 tablet (1,200 mg total) by mouth 2 (two) times daily. 10/24/15 10/31/15 Yes Sarah Harvie Bridge, PA-C  HYDROcodone-acetaminophen (NORCO/VICODIN) 5-325 MG tablet Take 1 tablet by mouth every 6 (six) hours as needed for moderate pain. 10/24/15  Yes Morrell Riddle, PA-C  ibuprofen (ADVIL,MOTRIN) 800 MG tablet Take 1 tablet (800 mg total) by mouth 3 (three) times daily. 10/26/15  Yes April Palumbo, MD  loratadine (CLARITIN) 10 MG tablet Take 10 mg by mouth daily.   Yes Historical Provider, MD    Family History No family history on file.  Social History Social History  Substance Use Topics  . Smoking status: Never Smoker  . Smokeless tobacco: Current User  . Alcohol use 3.6 oz/week    6 Cans of beer per week      Allergies   Review of patient's allergies indicates no known allergies.   Review of Systems Review of Systems   Physical Exam Updated Vital Signs BP 127/72   Pulse 76   Temp 98.7 F (37.1 C)   Resp 18   SpO2 99%   Physical Exam   ED Treatments / Results  Labs (all labs ordered are listed, but only abnormal results are displayed) Labs Reviewed  CSF CELL COUNT WITH DIFFERENTIAL - Abnormal; Notable for the following:       Result Value   WBC, CSF 17 (*)    Segmented Neutrophils-CSF 71 (*)    Lymphs, CSF 16 (*)    Monocyte-Macrophage-Spinal Fluid 13 (*)    All other components within normal limits  CSF CELL COUNT WITH DIFFERENTIAL - Abnormal; Notable for the following:    RBC Count, CSF 1 (*)    WBC, CSF 14 (*)    Segmented Neutrophils-CSF 57 (*)    Lymphs, CSF 24 (*)    All other components within normal limits  PROTEIN, CSF - Abnormal; Notable for the following:    Total  Protein, CSF 60 (*)    All other components within normal limits  CBC - Abnormal; Notable for the following:    RBC 4.08 (*)    HCT 38.3 (*)    All other components within normal limits  BASIC METABOLIC  PANEL - Abnormal; Notable for the following:    Sodium 132 (*)    CO2 21 (*)    Calcium 8.3 (*)    All other components within normal limits  CBC - Abnormal; Notable for the following:    RBC 3.82 (*)    Hemoglobin 12.3 (*)    HCT 35.7 (*)    Platelets 147 (*)    All other components within normal limits  I-STAT CHEM 8, ED - Abnormal; Notable for the following:    Sodium 133 (*)    Chloride 99 (*)    Glucose, Bld 102 (*)    Calcium, Ion 1.01 (*)    All other components within normal limits  CSF CULTURE  CBC  GLUCOSE, CSF  B. BURGDORFI ANTIBODIES  CREATININE, SERUM  HIV ANTIBODY (ROUTINE TESTING)  PATHOLOGIST SMEAR REVIEW  ROCKY MTN SPOTTED FVR ABS PNL(IGG+IGM)  B. BURGDORFI ANTIBODIES, CSF  HERPES SIMPLEX VIRUS(HSV) DNA BY PCR    EKG  EKG Interpretation None        Radiology Dg Chest 2 View  Result Date: 10/26/2015 CLINICAL DATA:  Fever beginning July 31st, headache and body aches tonight. Recent tick bite. EXAM: CHEST  2 VIEW COMPARISON:  Chest radiograph May 11, 2010 FINDINGS: Cardiomediastinal silhouette is normal. The lungs are clear without pleural effusions or focal consolidations. Trachea projects midline and there is no pneumothorax. Soft tissue planes and included osseous structures are non-suspicious. IMPRESSION: Normal chest. Electronically Signed   By: Awilda Metro M.D.   On: 10/26/2015 00:35    Procedures .Lumbar Puncture Date/Time: 10/27/2015 3:24 PM Performed by: Gwyneth Sprout Authorized by: Gwyneth Sprout   Consent:    Consent obtained:  Written   Consent given by:  Patient and spouse   Risks discussed:  Bleeding, headache and infection   Alternatives discussed:  No treatment Pre-procedure details:    Procedure purpose:  Diagnostic   Preparation: Patient was prepped and draped in usual sterile fashion   Anesthesia (see MAR for exact dosages):    Anesthesia method:  Local infiltration   Local anesthetic:  Lidocaine 1% w/o epi Procedure details:    Lumbar space:  L4-L5 interspace   Patient position:  Sitting   Needle gauge:  22   Needle type:  Diamond point   Needle length (in):  5.0   Ultrasound guidance: no     Number of attempts:  1   Fluid appearance:  Clear   Tubes of fluid:  4   Total volume (ml):  4 Post-procedure:    Puncture site:  Adhesive bandage applied   Patient tolerance of procedure:  Tolerated well, no immediate complications   (including critical care time)  Medications Ordered in ED Medications  doxycycline (VIBRA-TABS) tablet 100 mg (100 mg Oral Given 10/27/15 0515)  enoxaparin (LOVENOX) injection 40 mg (40 mg Subcutaneous Given 10/26/15 2212)  0.9 %  sodium chloride infusion ( Intravenous Duplicate 10/26/15 2030)  0.9 %  sodium chloride infusion ( Intravenous New Bag/Given 10/27/15 0517)   oxyCODONE (Oxy IR/ROXICODONE) immediate release tablet 5 mg (5 mg Oral Given 10/27/15 0255)  cefTRIAXone (ROCEPHIN) 2 g in dextrose 5 % 50 mL IVPB (2 g Intravenous Given 10/27/15 0639)  ketorolac (TORADOL) 30 MG/ML injection 30 mg (30 mg Intravenous Given 10/27/15 0805)  morphine 2 MG/ML injection 2 mg (2 mg Intravenous Given 10/27/15 1126)  metoCLOPramide (REGLAN) injection 10 mg (10 mg Intravenous Given 10/27/15 0805)  ampicillin (OMNIPEN) 2 g in sodium chloride 0.9 % 50  mL IVPB (2 g Intravenous Given 10/27/15 1157)  vancomycin (VANCOCIN) IVPB 1000 mg/200 mL premix (1,000 mg Intravenous Given 10/27/15 1255)  diphenhydrAMINE (BENADRYL) injection 25 mg (25 mg Intravenous Given 10/26/15 1153)  ketorolac (TORADOL) 30 MG/ML injection 30 mg (30 mg Intravenous Given 10/26/15 1153)  metoCLOPramide (REGLAN) injection 10 mg (10 mg Intravenous Given 10/26/15 1153)  sodium chloride 0.9 % bolus 1,000 mL (0 mLs Intravenous Stopped 10/26/15 1550)  morphine 4 MG/ML injection 4 mg (4 mg Intravenous Given 10/26/15 1426)  cefTRIAXone (ROCEPHIN) 2 g in dextrose 5 % 50 mL IVPB (0 g Intravenous Stopped 10/26/15 1754)  0.9 %  sodium chloride infusion ( Intravenous Transfusing/Transfer 10/26/15 1938)  acetaminophen (TYLENOL) tablet 500 mg (500 mg Oral Given 10/26/15 2211)  ondansetron (ZOFRAN) injection 4 mg (4 mg Intravenous Given 10/26/15 2017)     Initial Impression / Assessment and Plan / ED Course  I have reviewed the triage vital signs and the nursing notes.  Pertinent labs & imaging results that were available during my care of the patient were reviewed by me and considered in my medical decision making (see chart for details).  Clinical Course      Final Clinical Impressions(s) / ED Diagnoses   Final diagnoses:  Meningitis    New Prescriptions Current Discharge Medication List       Gwyneth Sprout, MD 10/27/15 1526

## 2015-10-27 NOTE — Progress Notes (Addendum)
Pharmacy Antibiotic Note  Alexander Gross is a 55 y.o. male admitted on 10/26/2015 with headache and fever.  Pharmacy has been consulted for Ampiciliin, Vancomycin and Ceftiaxone dosing for meningitis coverage.  Elevated WBC and protein in CSF.   Ceftriaxone begun 8/2 pm.  Adding Vanc and Ampicillin today.   To continue on Doxycycline PO for tick exposure.    Tmax 100, WBC 7.9.  Good renal function.  Cultures and viral studies in process.  Plan: Vancomycin 1 gm IV every 8 hours.  Goal trough 15-20 mcg/mL.  Ampicillin 2gm IV q4hrs. Continue Ceftriaxone 2gm IV q12hrs. Also on Doxycycline 100 mg PO BID. Follow renal function, culture date, viral studies, clinical progress. Vanc trough level at steady-state.     Temp (24hrs), Avg:99.3 F (37.4 C), Min:99.1 F (37.3 C), Max:100 F (37.8 C)   Recent Labs Lab 10/24/15 1032 10/25/15 2317 10/25/15 2337 10/26/15 0209 10/26/15 1136 10/26/15 1218 10/26/15 2100 10/27/15 0422  WBC 6.9 10.1  --   --  8.2  --  8.2 7.9  CREATININE  --   --  0.90  --   --  0.90 0.93 0.81  LATICACIDVEN  --   --  0.91 0.63  --   --   --   --     Estimated Creatinine Clearance: 102 mL/min (by C-G formula based on SCr of 0.81 mg/dL).    No Known Allergies  Antimicrobials this admission: Ceftriaxone 8/2>> Doxy PO 8/2>> Ampicillin 8/3>> Vanc 8/3>> Augmentin pta 7/31>>8/1  Dose adjustments this admission:  n/a  Microbiology results: 8/2 CSF - no organisms seen on gram stain, pending 8/2 uriine - cancelled 8/3 HIV - in process 8/2 HSV PCR - in process 8/2 B. Burgdorfi antibodies, CSF - in process 8/2 RMSF antibodies - in process  Dennie Fetters, [place] Pager: [phone] 10/27/2015 11:26 AM

## 2015-10-27 NOTE — Progress Notes (Signed)
Noticed smokeless tobacco in patient's room.  Educated patient that this is a tobacco free campus.

## 2015-10-27 NOTE — Progress Notes (Signed)
Family Medicine Teaching Service Daily Progress Note Intern Pager: [phone]  Patient name: Alexander Gross Medical record number: [id number] Date of birth: [birth year] Age: 55 y.o. Gender: male  Primary Care Provider: No PCP Per Patient Consultants: None Code Status: FULL  Pt Overview and Major Events to Date:  10/26/2015: Patient admitted to FPTS   Assessment and Plan: SALVATORE SHEAR is a 55yo male presenting with progressively worsening headache, neck stiffness  and fever to 103.4, found to have CSF studies consistent with neurologic infectious process most concerning for meningitis.   PMHx of smokeless tobacco and chronic sinusitis.  #Headache/Photophobia: fever and CSF consistent with neurologic infectious process, most concerning for bacterial vs. viral vs meningitis vs tick-born illness given possible tick exposure. CT head negative for acute pathology. VS WNL this AM (T99.1, P88, BP 142/81) - f/u B burgdorfi antibodies, rocky mountain spotted fever antibodies, CSF culture, UCx, blood culture, HIV Ab, HSV - Doxycycline BID for concern for Lyme vs RMSF - Rocephin, vancomycin, ampicillin for empiric bacterial meningitis coverage, plan on narrowing therapy based on pending culture results - Tylenol PRN fever - tylenol, toradol, morphine PRN pain, reglan PRN nausea - continue aerosol precautions given unknown etiology of meningitis  Chronic sinusitis - per history.  Patient did have Sinus XR on 7/31 positive for retention cyst or polyp inferiorly in right maxillary sinus, no fluid levels observed -  No clear nasal drainage, suggesting against CSF leak.  Will continue Claritin and Guifenesin if necessary.   FEN/GI: clear fluid diet, 169mL/hr 0.9% NS PPx: lovenox  Disposition: med/surg FPTS  Subjective:  Patient's pain improved with toradol and morphine this morning and he was more comfortable by the time I saw him.    Objective: Temp:  [99.1 F (37.3 C)-101.7 F (38.7 C)] 99.1  F (37.3 C) (08/03 0625) Pulse Rate:  [60-123] 88 (08/03 0625) Resp:  [18] 18 (08/03 0625) BP: (114-153)/(72-105) 142/81 (08/03 0625) SpO2:  [95 %-100 %] 99 % (08/03 0625) Physical Exam: General: Patient lies in bed, looks mildly uncomfortable Cardiovascular: RRR, no murmurs rubs or gallops Respiratory: CTA bilaterally Abdomen: soft, nontender,nondistended Extremities: full ROM, soft, warm and well-perfused  SKIN: No rashes appreciated NEURO: AAOx3, CNII-XII grossly intact, negative Kernig, negative Brudsinski, no nuchal rigidity Laboratory:  Recent Labs Lab 10/26/15 1136 10/26/15 1218 10/26/15 2100 10/27/15 0422  WBC 8.2  --  8.2 7.9  HGB 14.3 14.3 13.1 12.3*  HCT 41.3 42.0 38.3* 35.7*  PLT 171  --  157 147*    Recent Labs Lab 10/25/15 2337 10/26/15 1218 10/26/15 2100 10/27/15 0422  NA 132* 133*  --  132*  K 3.7 4.1  --  4.4  CL 95* 99*  --  102  CO2  --   --   --  21*  BUN 4* 7  --  8  CREATININE 0.90 0.90 0.93 0.81  CALCIUM  --   --   --  8.3*  GLUCOSE 119* 102*  --  98     Imaging/Diagnostic Tests: Dg Sinus 1-2 Views  Result Date: 10/24/2015 CLINICAL DATA:  Three days of headache with dental pain; history of uncontrolled allergies EXAM: PARANASAL SINUSES - 1-2 VIEW COMPARISON:  Report of head CT scan of July 10, 2002 which indicated the paranasal sinuses were normal where visualized. FINDINGS: There is soft tissue fullness in the inferior aspect of the right maxillary sinus consistent with a polyp or asymmetric mucoperiosteal thickening. The left maxillary sinus is clear. The frontal and ethmoid  sinuses are clear. IMPRESSION: Probable retention cyst or polyp inferiorly in the right maxillary sinus measuring up to 2 cm in diameter. No air-fluid levels are observed. Electronically Signed   By: David  [place] M.D.   On: 10/24/2015 10:43  Dg Chest 2 View  Result Date: 10/26/2015 CLINICAL DATA:  Fever beginning July 31st, headache and body aches tonight. Recent tick  bite. EXAM: CHEST  2 VIEW COMPARISON:  Chest radiograph May 11, 2010 FINDINGS: Cardiomediastinal silhouette is normal. The lungs are clear without pleural effusions or focal consolidations. Trachea projects midline and there is no pneumothorax. Soft tissue planes and included osseous structures are non-suspicious. IMPRESSION: Normal chest. Electronically Signed   By: Awilda Metro M.D.   On: 10/26/2015 00:35   Ct Head Wo Contrast  Result Date: 10/24/2015 CLINICAL DATA:  Headache for 2 days EXAM: CT HEAD WITHOUT CONTRAST TECHNIQUE: Contiguous axial images were obtained from the base of the skull through the vertex without intravenous contrast. COMPARISON:  None. FINDINGS: There is streak artifact over the right frontal lobe laterally. No obvious acute hemorrhage. No mass effect midline shift. Mastoid air cells are clear. Visualized paranasal sinuses are clear. Intact cranium. IMPRESSION: No acute intracranial pathology. Electronically Signed   By: Jolaine Click M.D.   On: 10/24/2015 12:57    Howard Pouch, MD 10/27/2015, 6:49 AM PGY-1, [facility] FPTS Intern pager: [phone], text pages welcome

## 2015-10-28 LAB — BASIC METABOLIC PANEL
ANION GAP: 9 (ref 5–15)
BUN: 6 mg/dL (ref 6–20)
CALCIUM: 8.3 mg/dL — AB (ref 8.9–10.3)
CO2: 23 mmol/L (ref 22–32)
CREATININE: 0.74 mg/dL (ref 0.61–1.24)
Chloride: 100 mmol/L — ABNORMAL LOW (ref 101–111)
Glucose, Bld: 106 mg/dL — ABNORMAL HIGH (ref 65–99)
Potassium: 3.6 mmol/L (ref 3.5–5.1)
SODIUM: 132 mmol/L — AB (ref 135–145)

## 2015-10-28 LAB — CBC
HCT: 34.3 % — ABNORMAL LOW (ref 39.0–52.0)
Hemoglobin: 12 g/dL — ABNORMAL LOW (ref 13.0–17.0)
MCH: 32.1 pg (ref 26.0–34.0)
MCHC: 35 g/dL (ref 30.0–36.0)
MCV: 91.7 fL (ref 78.0–100.0)
PLATELETS: 151 10*3/uL (ref 150–400)
RBC: 3.74 MIL/uL — AB (ref 4.22–5.81)
RDW: 11.6 % (ref 11.5–15.5)
WBC: 7.1 10*3/uL (ref 4.0–10.5)

## 2015-10-28 MED ORDER — ENSURE ENLIVE PO LIQD
237.0000 mL | Freq: Three times a day (TID) | ORAL | Status: DC
  Administered 2015-10-28 – 2015-10-30 (×3): 237 mL via ORAL

## 2015-10-28 MED ORDER — ONDANSETRON HCL 4 MG/2ML IJ SOLN
4.0000 mg | Freq: Four times a day (QID) | INTRAMUSCULAR | Status: DC | PRN
  Administered 2015-10-28: 4 mg via INTRAVENOUS
  Filled 2015-10-28: qty 2

## 2015-10-28 MED ORDER — NICOTINE 14 MG/24HR TD PT24
14.0000 mg | MEDICATED_PATCH | Freq: Every day | TRANSDERMAL | Status: DC
  Administered 2015-10-28 – 2015-10-30 (×3): 14 mg via TRANSDERMAL
  Filled 2015-10-28 (×3): qty 1

## 2015-10-28 MED ORDER — AMITRIPTYLINE HCL 25 MG PO TABS
25.0000 mg | ORAL_TABLET | Freq: Every day | ORAL | Status: DC
  Administered 2015-10-28 – 2015-10-29 (×2): 25 mg via ORAL
  Filled 2015-10-28 (×2): qty 1

## 2015-10-28 NOTE — Progress Notes (Signed)
Pt family expressed concerns about pt with RN stating pt couldn't recognize his friends name/text and stated "whose in the hospital?" after reading a text his friends had sent him; pt couldn't remember he is in the hospital. MD notified and she stated she will come up to see pt. Pt in bed with call light within reach. Will continue to closely monitor Pt. Dionne Bucy RN

## 2015-10-28 NOTE — Progress Notes (Signed)
Initial Nutrition Assessment  DOCUMENTATION CODES:   Not applicable  INTERVENTION:  Provide Ensure Enlive po TID, each supplement provides 350 kcal and 20 grams of protein.  Encourage adequate PO intake.   NUTRITION DIAGNOSIS:   Inadequate oral intake related to nausea, vomiting as evidenced by meal completion < 50%.  GOAL:   Patient will meet greater than or equal to 90% of their needs  MONITOR:   PO intake, Supplement acceptance, Labs, Weight trends, Skin, I & O's  REASON FOR ASSESSMENT:   Malnutrition Screening Tool    ASSESSMENT:   55yo male presenting with progressively worsening headache, neck stiffness  and fever to 103.4, found to have CSF studies consistent with neurologic infectious process most concerning for meningitis.  PMHx of smokeless tobacco and chronic sinusitis.  Pt reports having a decreased appetite which has been ongoing over the past 1 week. Pt reports he has only been able to consume some fluids and some solid foods, however po intake was poor. Current meal completion has been 10-25%. Pt with n/v. Pt reports usual body weight 168 lbs last weighing 2 weeks ago. Pt with a 3.5% weight loss in 2 weeks, not significant for time frame. Pt currently has Ensure ordered. RD to increase Ensure to TID to aid in caloric and protein needs.   Pt with no observed significant fat or muscle mass loss.   Labs and medications reviewed.   Diet Order:  Diet regular Room service appropriate? Yes; Fluid consistency: Thin  Skin:  Reviewed, no issues  Last BM:  8/1  Height:   Ht Readings from Last 1 Encounters:  10/25/15 5\' 8"  (1.727 m)    Weight:   Wt Readings from Last 1 Encounters:  10/25/15 162 lb (73.5 kg)    Ideal Body Weight:  70 kg  BMI:  There is no height or weight on file to calculate BMI.  Estimated Nutritional Needs:   Kcal:  2000-2200  Protein:  85-95 grams  Fluid:  2- 2.2 L/day  EDUCATION NEEDS:   Education needs  addressed  Roslyn Smiling, MS, RD, LDN Pager # [phone] After hours/ weekend pager # [phone]

## 2015-10-28 NOTE — Progress Notes (Signed)
Family Medicine Teaching Service Daily Progress Note Intern Pager: [phone]  Patient name: Alexander Gross Medical record number: [id number] Date of birth: 1960-07-04 Age: 55 y.o. Gender: male  Primary Care Provider: Katy Apo, MD Consultants: None Code Status: FULL  Pt Overview and Major Events to Date:  10/26/2015: Patient admitted to FPTS  Assessment and Plan: Alexander Gross a 55yo male presenting with progressively worsening headache, neck stiffness and fever to 103.4, found to have CSF studies consistent with neurologic infectious process most concerning for meningitis.  PMHx of smokeless tobacco and chronic sinusitis.  #Headache/Photophobia: fever and CSF consistent with neurologic infectious process, most concerning for bacterial vs. viral vs meningitis vs tick-born illness given possible tick exposure. Pt afebrile this AM (T99.1, P72, BP 144/90) Lyme IgM + IgG (-), HIV (-), RMSF pending, CSF culture now growth at 1 day, HSV negative, Blood cx no growth x 1d - f/u rocky mountain spotted fever antibodies, CSF culture, blood culture   - Doxycycline BID (day #3) for concern for Lyme vs RMSF - Rocephin (day #3), vancomycin (day #2), ampicillin(day #2) for empiric bacterial meningitis coverage, plan on narrowing therapy based on pending culture results - Tylenol PRN fever - tylenol, toradol, morphine PRN pain, reglan PRN nausea - continue aerosol precautions given unknown etiology of meningitis - added on CSF fungal cultures - will consult neurology for recs  #Chronic sinusitis - per history. Patient did have Sinus XR on 7/31 positive for retention cyst or polyp inferiorly in right maxillary sinus, no fluid levels observed  -  No clear nasal drainage, suggesting against CSF leak.   - Continue Claritin and Guifenesin if necessary  #Nausea/vomiting - PRN zofran, reglan  FEN/GI: clear fluid diet, 161mL/hr 0.9% NS PPx: lovenox   Disposition: med/surg FPTS  Subjective:   Patient with +nausea, 4 episodes of nonbilious emesis this AM.  Some confusion over if he rec'd reglan as it was not charted, however PRN reglan and zofran on board. +increased headache this AM with breakthrough pain 3hrs after morphine was given.  Objective: Temp:  [97.5 F (36.4 C)-99.4 F (37.4 C)] 99 F (37.2 C) (08/04 0446) Pulse Rate:  [72-76] 72 (08/04 0446) Resp:  [18] 18 (08/04 0446) BP: (127-145)/(72-90) 144/90 (08/04 0446) SpO2:  [99 %] 99 % (08/04 0446) Physical Exam: General: +mild distress   Cardiovascular: RRR, no m/r/g Respiratory: CTA bil, no  W/R/R Abdomen: soft, nt, nd, normoactive BS NEURO: AAOx3, CNII-XII grossly intact, no nuchal rigidity, negative kurnigs and brudsinski signs  Laboratory:  Recent Labs Lab 10/26/15 2100 10/27/15 0422 10/28/15 0311  WBC 8.2 7.9 7.1  HGB 13.1 12.3* 12.0*  HCT 38.3* 35.7* 34.3*  PLT 157 147* 151    Recent Labs Lab 10/26/15 1218 10/26/15 2100 10/27/15 0422 10/28/15 0311  NA 133*  --  132* 132*  K 4.1  --  4.4 3.6  CL 99*  --  102 100*  CO2  --   --  21* 23  BUN 7  --  8 6  CREATININE 0.90 0.93 0.81 0.74  CALCIUM  --   --  8.3* 8.3*  GLUCOSE 102*  --  98 106*      Imaging/Diagnostic Tests: No results found.   Howard Pouch, MD 10/28/2015, 7:15 AM PGY-1, [facility] FPTS Intern pager: [phone], text pages welcome

## 2015-10-28 NOTE — Consult Note (Addendum)
Neurology Consult Note  Reason for Consultation: meningitis  Requesting provider: Howard Pouch, MD  CC: headache  HPI: This is a 55 year old right-handed man who was admitted to [facility] on 10/26/15 for evaluation of possible meningitis. History is obtained directly from the patient who is an excellent historian. I also reviewed his medical record at length.  The patient reports that he first developed headache on 10/22/15. He woke up that morning with a moderate headache. 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 He went to bed that night and woke up the next morning with ongoing headache. Again, headache was present when he woke up in the morning and seemed to improve a little bit only to get worse by the evening. His headache was accompanied by fever with temperatures as high as 102.7. He saw his PCP on 10/24/15 and was felt to have possible sinusitis for which he was given amoxicillin. He continued to have fever and severe headache with associated anorexia, nausea, vomiting, and neck pain. He called his physician's office and was told to come to the emergency department. He presented to [facility] ED on the evening of 10/25/15. He was noted to have a temperature of 101.40F with no meningismus or focal neurologic deficits. Labs were largely unremarkable apart from sodium of 132 and glucose 119. There was some concern that he may been exposed to a tick bite so as amoxicillin was switched to doxycycline. LP was deferred because he was not noted to have any meningeal signs and he had a normal head CT. He was discharged home from the emergency department.  He was to follow-up with his primary care provider and it was recommended that the have serologies performed to test for Lyme disease. He returned to his PCPs office on 10/26/15 and was told to go back to the emergency department due to concerns that he had viral meningitis. Lumbar  puncture was performed in the emergency department and showed a neutrophilic pleocytosis with mildly elevated protein. The patient was noted to be ill-appearing and photophobic with normal mental status and no focal neurologic deficits. He was placed on empiric antibiotic coverage for bacterial meningitis including ceftriaxone, ampicillin, vancomycin, and doxycycline. He was admitted for further evaluation. Since his admission, he has continued to have headache with nausea and vomiting. He reports that he has had several episodes of dry heaves and one episode of vomiting today. Overall, he feels that his headache has improved but thinks this is because of pain medication. When the pain medication wears off his headache remains significant. His headache is located in both temporal regions and spreads across his forehead. Headache increases with vomiting. He does have some photophobia. He denies sensitivity to sound. He denies any weakness, numbness, tingling, vision loss, double vision, vertigo, difficulty swallowing, difficulty talking, discoordination, or balance problems.  His wife reported to the ED physician that 2 months ago when he was hiking he was bitten by multiple takes and developed a rash near one of these bites. He was reportedly seen by a dermatologist who said that the rash was related to his underlying rosacea. He works outside frequently and states that he has likely been bitten by mosquitoes numerous times. He has not had any sick contacts recently. He denies any preceding respiratory or GI illness. He is not immunocompromised. He has not had any recent travel outside of the area.   PMH:  Past Medical  History:  Diagnosis Date  . GERD (gastroesophageal reflux disease)   . Headache    "daily since last Saturday" (10/27/2015)  . Tick bite ~ 08/2015   bitten by multiple ticks about two months ago when hiking in /notes 10/26/2015    PSH:  Past Surgical History:  Procedure Laterality Date   . TONSILLECTOMY      Family history: Denies any major health problems in the family.  Social history:  He is married and lives with his wife. He has no prior history of cigarette smoking. He dips snuff. He drinks about 12 cans of beer per week. There is no reported illicit drug use. 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   Current inpatient meds:  Current Facility-Administered Medications  Medication Dose Route Frequency Provider Last Rate Last Dose  . 0.9 %  sodium chloride infusion   Intravenous Continuous Alyssa A Haney, MD      . 0.9 %  sodium chloride infusion   Intravenous Continuous Bonney Aid, MD 125 mL/hr at 10/28/15 0230    . ampicillin (OMNIPEN) 2 g in sodium chloride 0.9 % 50 mL IVPB  2 g Intravenous Q4H Scarlett Presto, RPH   2 g at 10/28/15 1119  . cefTRIAXone (ROCEPHIN) 2 g in dextrose 5 % 50 mL IVPB  2 g Intravenous Q12H Uvaldo Rising, MD   2 g at 10/28/15 1157  . doxycycline (VIBRA-TABS) tablet 100 mg  100 mg Oral Q12H Marquette Saa, MD   100 mg at 10/28/15 2620  . enoxaparin (LOVENOX) injection 40 mg  40 mg Subcutaneous Q24H Bonney Aid, MD   40 mg at 10/27/15 2206  . feeding supplement (ENSURE ENLIVE) (ENSURE ENLIVE) liquid 237 mL  237 mL Oral TID BM Uvaldo Rising, MD      . ketorolac (TORADOL) 30 MG/ML injection 30 mg  30 mg Intravenous Q8H PRN Uvaldo Rising, MD   30 mg at 10/28/15 1202  . metoCLOPramide (REGLAN) injection 10 mg  10 mg Intravenous TID PRN Uvaldo Rising, MD   10 mg at 10/28/15 0900  . morphine 2 MG/ML injection 2 mg  2 mg Intravenous Q4H PRN Uvaldo Rising, MD   2 mg at 10/28/15 0846  . nicotine (NICODERM CQ - dosed in mg/24 hours) patch 14 mg  14 mg Transdermal Daily Uvaldo Rising, MD   14 mg at 10/28/15 1033  . ondansetron (ZOFRAN) injection 4 mg  4 mg Intravenous Q6H PRN Howard Pouch, MD   4 mg at 10/28/15 1224  . oxyCODONE (Oxy IR/ROXICODONE) immediate release tablet 5 mg  5 mg Oral Q6H PRN Bonney Aid, MD   5  mg at 10/28/15 1202  . vancomycin (VANCOCIN) IVPB 1000 mg/200 mL premix  1,000 mg Intravenous Q8H Scarlett Presto, RPH   1,000 mg at 10/28/15 1224    Allergies: No Known Allergies  ROS: As per HPI. A full 14-point review of systems was performed and is otherwiseNotable for diffuse arthralgias. He also states that he had frequent vomiting even before this admission which he attributes to a polyp in his sinus which triggers coughing. His coughing spells are so severe that he will retch and vomit. The remainder of his resistance is negative.  PE:  BP (!) 142/85 (BP Location: Left Arm)   Pulse 86   Temp 98.1 F (36.7 C) (Oral)   Resp 18   SpO2 100%   General: WDWN in mild  distress due to headache and nausea. The overhead lights are off because of photophobia. He is AAO x4. Speech clear, no dysarthria. No aphasia. Follows commands briskly. Affect is bright with congruent mood. Comportment is normal.  HEENT: Normocephalic. He has mild meningismus, no LAD. MM slightly dry, OP clear. Dentition good. Sclerae anicteric. No conjunctival injection.  CV: Regular, no murmur. Carotid pulses full and symmetric, no bruits. Distal pulses 2+ and symmetric.  Lungs: CTAB.  Abdomen: Soft, non-distended, non-tender. Bowel sounds present x4.  Extremities: No C/C/E. Neuro:  CN: Pupils are equal and round. They are symmetrically reactive from 3-->2 mm. EOMI without nystagmus. No reported diplopia. Facial sensation is intact to light touch. Face is symmetric at rest with normal strength and mobility. Hearing is intact to conversational voice. Palate elevates symmetrically and uvula is midline. Voice is normal in tone, pitch and quality. Bilateral SCM and trapezii are 5/5. Tongue is midline with normal bulk and mobility.  Motor: Normal bulk, tone, and strength. No tremor or other abnormal movements. No drift.  Sensation: Intact to light touch, pinprick, vibration, and joint position.  DTRs: 2+, symmetric. Toes  downgoing bilaterally. No pathologic reflexes.  Coordination: Finger-to-nose and heel-to-shin are without dysmetria. Finger taps are normal in amplitude and speed, no decrement.  Gait: Deferred due to headache and nausea.   Labs:  Lab Results  Component Value Date   WBC 7.1 10/28/2015   HGB 12.0 (L) 10/28/2015   HCT 34.3 (L) 10/28/2015   PLT 151 10/28/2015   GLUCOSE 106 (H) 10/28/2015   NA 132 (L) 10/28/2015   K 3.6 10/28/2015   CL 100 (L) 10/28/2015   CREATININE 0.74 10/28/2015   BUN 6 10/28/2015   CO2 23 10/28/2015   HIV antibody negative Serum B. bergdorferi antibodies negative Blood cultures negative 2 Urinalysis normal [facility] spotted fever antibody panel pending  CSF white blood cell count 17-->14, 57% PMNs CSF red blood cells 0-->1 CSF protein 60 CSF glucose 60 (serum 102) CSF Gram stain and culture negative CSF HSV PCR negative  Imaging:  I have personally and independently reviewed the CT scan of the head from 10/24/15. This is unremarkable.  Assessment and Plan:  1. Aseptic meningitis: Overall his clinical picture is most consistent with a viral meningitis. He did have a neutrophilic pleocytosis, though this is not uncommon in the early stages of a viral meningitis with eventual conversion of neutrophils to lymphocytes as the illness progresses. The most common causative pathogens at this time of year would include arboviruses and enterovirus. He is not immunosuppressed and is HIV negative. He has not had any recent travel outside of the area. Connective tissue diseases can produce aseptic meningitis, though the degree of fever in this case would favor an infectious process. He denies taking significant doses of ibuprofen or any other medications that have been linked to aseptic meningitis. There is some question about potential tick exposure although the patient himself denies this. At this point, treatment remains supportive. Ensure adequate IV fluid hydration  as needed. I would recommend discontinuing ceftriaxone, vancomycin, and ampicillin given that he has no evidence of a bacterial meningitis. He can be continued on doxycycline pending completion of the evaluation for possible tickborne illness. I would add enterovirus PCR and an arbovirus panel (to include [place] Nile virus, [place] equine virus, [place] encephalitis virus) to the CSF in the lab if at all possible. He has no cortical signs or other evidence to suggest encephalitis so MRI brain can be deferred  at this time.   2. Headache: This remains severe, associated with nausea. This is typical for viral meningitis. These headaches can persist for several weeks. Continue with pain medication as needed. I would also recommend the addition of amitriptyline 25 mg at bedtime. This can be increased by 25 mg each week to 75-100 mg daily as needed.  This was discussed with the patient and he is in agreement with the plan as stated. He was given the opportunity to ask any questions and these were addressed to his satisfaction. No family is present at the bedside at the time of my visit.  Thank you for the opportunity to participate in Mr. Deroos care. Please feel free to call with any additional questions or concerns.   I discussed my recommendations with Dr. Nelson Chimes, Intern on call, at the time of my consult.

## 2015-10-29 LAB — CBC
HEMATOCRIT: 35 % — AB (ref 39.0–52.0)
HEMOGLOBIN: 12.2 g/dL — AB (ref 13.0–17.0)
MCH: 31.9 pg (ref 26.0–34.0)
MCHC: 34.9 g/dL (ref 30.0–36.0)
MCV: 91.4 fL (ref 78.0–100.0)
Platelets: 173 10*3/uL (ref 150–400)
RBC: 3.83 MIL/uL — AB (ref 4.22–5.81)
RDW: 11.9 % (ref 11.5–15.5)
WBC: 5.2 10*3/uL (ref 4.0–10.5)

## 2015-10-29 LAB — BASIC METABOLIC PANEL
ANION GAP: 8 (ref 5–15)
BUN: <5 mg/dL — ABNORMAL LOW (ref 6–20)
CHLORIDE: 101 mmol/L (ref 101–111)
CO2: 26 mmol/L (ref 22–32)
Calcium: 8.7 mg/dL — ABNORMAL LOW (ref 8.9–10.3)
Creatinine, Ser: 0.71 mg/dL (ref 0.61–1.24)
GFR calc non Af Amer: >60 mL/min (ref >60)
Glucose, Bld: 99 mg/dL (ref 65–99)
POTASSIUM: 3.7 mmol/L (ref 3.5–5.1)
SODIUM: 135 mmol/L (ref 135–145)

## 2015-10-29 LAB — CSF CULTURE W GRAM STAIN: Culture: NO GROWTH

## 2015-10-29 LAB — CSF CULTURE: SPECIAL REQUESTS: NORMAL

## 2015-10-29 MED ORDER — IBUPROFEN 200 MG PO TABS: 600.0000 mg | ORAL_TABLET | Freq: Four times a day (QID) | ORAL | Status: DC | PRN

## 2015-10-29 MED ORDER — MORPHINE SULFATE (PF) 2 MG/ML IV SOLN: 2.0000 mg | INTRAVENOUS | Status: DC | PRN

## 2015-10-29 MED ORDER — DOCUSATE SODIUM 50 MG PO CAPS
50.0000 mg | ORAL_CAPSULE | Freq: Every day | ORAL | Status: DC
  Administered 2015-10-29 – 2015-10-30 (×2): 50 mg via ORAL
  Filled 2015-10-29 (×2): qty 1

## 2015-10-29 MED ORDER — ONDANSETRON HCL 4 MG PO TABS: 4.0000 mg | ORAL_TABLET | Freq: Three times a day (TID) | ORAL | Status: DC | PRN

## 2015-10-29 MED ORDER — OXYCODONE HCL 5 MG PO TABS
5.0000 mg | ORAL_TABLET | Freq: Four times a day (QID) | ORAL | Status: DC
  Administered 2015-10-29 – 2015-10-30 (×3): 5 mg via ORAL
  Filled 2015-10-29 (×3): qty 1

## 2015-10-29 MED ORDER — IBUPROFEN 200 MG PO TABS
600.0000 mg | ORAL_TABLET | Freq: Four times a day (QID) | ORAL | Status: DC
  Administered 2015-10-29 – 2015-10-30 (×5): 600 mg via ORAL
  Filled 2015-10-29 (×5): qty 3

## 2015-10-29 MED ORDER — OXYCODONE HCL 5 MG PO TABS: 5.0000 mg | ORAL_TABLET | ORAL | Status: DC | PRN

## 2015-10-29 MED ORDER — METOCLOPRAMIDE HCL 10 MG PO TABS: 10.0000 mg | ORAL_TABLET | Freq: Three times a day (TID) | ORAL | Status: DC | PRN

## 2015-10-29 NOTE — Progress Notes (Signed)
Family Medicine Teaching Service Daily Progress Note Intern Pager: [phone]  Patient name: Alexander Gross Medical record number: [id number] Date of birth: [birth year] Age: 55 y.o. Gender: male  Primary Care Provider: Katy Apo, MD Consultants: Neurology Code Status: FULL  Pt Overview and Major Events to Date:  10/26/2015 Pt admitted to FPTS  Assessment and Plan: KHADEN KIDDY a 55yo male presenting with progressively worsening headache, neck stiffness and fever to 103.4, found to have CSF studies consistent with neurologic infectious process most concerning for meningitis. PMHx of smokeless tobacco and chronic sinusitis.  #Likely Viral Meningitis: CSF with elevated protein and WBC (neutrophils) gram stain no organisms, CSF culture no growth x2d.   HIV nonreactive, Lyme Abs negative, HSV negative.  RMSF titers pending.  - Discontinued Ceftriaxone, Vancomycin and ampicillin given no evidence of bacterial meningitis - Transition to PO Doxycycline (Day #4) for RMSF coverage pending titer results - f/u fungal cx, enterovirus cx, arbovirus cx, RMSF titers - cont droplet precuation - appreciate neurology recs  #Nausea/Headache Controlled on current regimen. - cont PRN reglan, zofran - Patient currently on toradol, morphine PRN - will transition to PO pain medications to optimize discharge regimen: start with standing Ibuprofen 600 mg q4h and PRN oxycodone 5 mg q4h - amitriptyline 25 mg qhs at bedtime added per neurology recs: This can be increased by 25 mg each week to 75-100 mg daily as needed.  #Chronic sinusitis - Stable.No clear nasal drainage, suggesting against CSF leak.  - Continue Claritin and Guifenesin if necessary  FEN/GI: clear fluid diet, 173mL/hr 0.9% NS PPx: lovenox  Disposition: Will transition to PO medications today and may possibly discharge to home tomorrow 8/6  Subjective:  Patient with pain controlled this AM.    Objective: Temp:  [97.9 F (36.6  C)-99.1 F (37.3 C)] 99.1 F (37.3 C) (08/05 1353) Pulse Rate:  [66-80] 70 (08/05 1353) Resp:  [16-17] 16 (08/05 1353) BP: (140-152)/(89-96) 145/89 (08/05 1353) SpO2:  [98 %-100 %] 98 % (08/05 1353) Physical Exam: General: patient lies in bed in no apparent distress  Cardiovascular: RRR, no murmurs rubs or gallops Respiratory: clear to auscultation bilaterally, no W./R/R Abdomen: soft, nontender, normoactive BS Extremities: full ROM if 4 extremities,warm and well-perfused with no edema NEURO: Negative Kernig's and brudsinski's signs, no nuchal rigidity, AAOx3, CNII-XII grossly intact  Laboratory:  Recent Labs Lab 10/27/15 0422 10/28/15 0311 10/29/15 0452  WBC 7.9 7.1 5.2  HGB 12.3* 12.0* 12.2*  HCT 35.7* 34.3* 35.0*  PLT 147* 151 173    Recent Labs Lab 10/27/15 0422 10/28/15 0311 10/29/15 0452  NA 132* 132* 135  K 4.4 3.6 3.7  CL 102 100* 101  CO2 21* 23 26  BUN 8 6 <5*  CREATININE 0.81 0.74 0.71  CALCIUM 8.3* 8.3* 8.7*  GLUCOSE 98 106* 99     Howard Pouch, MD 10/29/2015, 2:06 PM PGY-1, [facility] Family Medicine FPTS Intern pager: [phone], text pages welcome

## 2015-10-30 LAB — BASIC METABOLIC PANEL
ANION GAP: 7 (ref 5–15)
BUN: 6 mg/dL (ref 6–20)
CALCIUM: 8.7 mg/dL — AB (ref 8.9–10.3)
CO2: 28 mmol/L (ref 22–32)
Chloride: 98 mmol/L — ABNORMAL LOW (ref 101–111)
Creatinine, Ser: 0.77 mg/dL (ref 0.61–1.24)
GFR calc Af Amer: >60 mL/min (ref >60)
Glucose, Bld: 100 mg/dL — ABNORMAL HIGH (ref 65–99)
POTASSIUM: 3.9 mmol/L (ref 3.5–5.1)
SODIUM: 133 mmol/L — AB (ref 135–145)

## 2015-10-30 LAB — CBC
HCT: 36.4 % — ABNORMAL LOW (ref 39.0–52.0)
Hemoglobin: 12.5 g/dL — ABNORMAL LOW (ref 13.0–17.0)
MCH: 31.6 pg (ref 26.0–34.0)
MCHC: 34.3 g/dL (ref 30.0–36.0)
MCV: 91.9 fL (ref 78.0–100.0)
PLATELETS: 183 10*3/uL (ref 150–400)
RBC: 3.96 MIL/uL — AB (ref 4.22–5.81)
RDW: 11.9 % (ref 11.5–15.5)
WBC: 5.1 10*3/uL (ref 4.0–10.5)

## 2015-10-30 MED ORDER — ONDANSETRON HCL 4 MG PO TABS
4.0000 mg | ORAL_TABLET | Freq: Three times a day (TID) | ORAL | 0 refills | Status: DC | PRN
Start: 2015-10-30 — End: 2019-03-06

## 2015-10-30 MED ORDER — OXYCODONE HCL 5 MG PO TABS: 5.0000 mg | ORAL_TABLET | Freq: Four times a day (QID) | ORAL | 0 refills | Status: DC

## 2015-10-30 MED ORDER — AMITRIPTYLINE HCL 25 MG PO TABS: 25.0000 mg | ORAL_TABLET | Freq: Every day | ORAL | 0 refills | Status: DC

## 2015-10-30 MED ORDER — IBUPROFEN 600 MG PO TABS: 600.0000 mg | ORAL_TABLET | Freq: Four times a day (QID) | ORAL | 0 refills | Status: DC | PRN

## 2015-10-30 MED ORDER — DOXYCYCLINE HYCLATE 100 MG PO TABS: 100.0000 mg | ORAL_TABLET | Freq: Two times a day (BID) | ORAL | 0 refills | Status: AC

## 2015-10-30 NOTE — Progress Notes (Signed)
Neurology Progress Note  Subjective: He feels much better this morning. He states that he was able to sleep through the night without waking up from a headache. He also woke up headache-free this morning. He tolerated breakfast without nausea or vomiting. He is eager to go home and wants to know when he can go back to work.   Current Meds:   Current Facility-Administered Medications:  .  0.9 %  sodium chloride infusion, , Intravenous, Continuous, Bonney Aid, MD, Last Rate: 10 mL/hr at 10/29/15 2300 .  0.9 %  sodium chloride infusion, , Intravenous, Continuous, Bonney Aid, MD, Last Rate: 125 mL/hr at 10/28/15 1627 .  amitriptyline (ELAVIL) tablet 25 mg, 25 mg, Oral, QHS, Freddrick March, MD, 25 mg at 10/29/15 2106 .  docusate sodium (COLACE) capsule 50 mg, 50 mg, Oral, Daily, Howard Pouch, MD, 50 mg at 10/30/15 0905 .  doxycycline (VIBRA-TABS) tablet 100 mg, 100 mg, Oral, Q12H, Marquette Saa, MD, 100 mg at 10/30/15 0551 .  enoxaparin (LOVENOX) injection 40 mg, 40 mg, Subcutaneous, Q24H, Alyssa A Haney, MD, 40 mg at 10/29/15 2108 .  feeding supplement (ENSURE ENLIVE) (ENSURE ENLIVE) liquid 237 mL, 237 mL, Oral, TID BM, Uvaldo Rising, MD, 237 mL at 10/30/15 1000 .  ibuprofen (ADVIL,MOTRIN) tablet 600 mg, 600 mg, Oral, QID, Howard Pouch, MD, 600 mg at 10/30/15 0905 .  metoCLOPramide (REGLAN) tablet 10 mg, 10 mg, Oral, Q8H PRN, Howard Pouch, MD .  morphine 2 MG/ML injection 2 mg, 2 mg, Intravenous, Q4H PRN, Howard Pouch, MD .  nicotine (NICODERM CQ - dosed in mg/24 hours) patch 14 mg, 14 mg, Transdermal, Daily, Uvaldo Rising, MD, 14 mg at 10/30/15 0905 .  ondansetron (ZOFRAN) tablet 4 mg, 4 mg, Oral, Q8H PRN, Howard Pouch, MD .  oxyCODONE (Oxy IR/ROXICODONE) immediate release tablet 5 mg, 5 mg, Oral, Q6H, Uvaldo Rising, MD, 5 mg at 10/30/15 0329  Objective:  Temp:  [97.8 F (36.6 C)-99.1 F (37.3 C)] 97.8 F (36.6 C) (08/06 0551) Pulse Rate:  [70-90] 72 (08/06 0551) Resp:  [16-18] 18  (08/06 0551) BP: (145-154)/(87-92) 152/92 (08/06 0551) SpO2:  [98 %-99 %] 99 % (08/06 0551) Weight:  [73.5 kg (162 lb)] 73.5 kg (162 lb) (08/05 1500)  General: WDWN Caucasian man lying in bed in NAD. Alert, oriented x4. Speech is clear without dysarthria. Affect is bright. Comportment is normal.  HEENT: Neck is supple without lymphadenopathy. Sclerae are anicteric. There is no conjunctival injection.  Neuro: MS: As noted above. No aphasia.  CN: Pupils are equal and reactive from 3-->2 mm bilaterally. EOMI, no nystagmus. Facial sensation is intact to light touch. Face is symmetric at rest with normal strength and mobility. Hearing is intact to conversational voice. Voice is normal in tone and quality. Palate elevates symmetrically. Uvula is midline. Bilateral SCM and trapezii are 5/5. Tongue is midline with normal bulk and mobility.  Motor: Normal bulk, tone, and strength throughout with the exception of 4+/5 R ankle dorsiflexion and R EHL. No pronator drift. No tremor or other abnormal movements are observed.  Sensation: Intact to light touch, pinprick, vibration, and joint position.  DTRs: 2+, symmetric. Toes are downgoing bilaterally. No pathological reflexes.  Coordination: Finger-to-nose and heel-to-shin are without dysmetria bilaterally.  Gait: Casual gait is normal.   Labs: Lab Results  Component Value Date   WBC 5.1 10/30/2015   HGB 12.5 (L) 10/30/2015   HCT 36.4 (L) 10/30/2015   PLT 183 10/30/2015  GLUCOSE 100 (H) 10/30/2015   NA 133 (L) 10/30/2015   K 3.9 10/30/2015   CL 98 (L) 10/30/2015   CREATININE 0.77 10/30/2015   BUN 6 10/30/2015   CO2 28 10/30/2015   CBC Latest Ref Rng & Units 10/30/2015 10/29/2015 10/28/2015  WBC 4.0 - 10.5 K/uL 5.1 5.2 7.1  Hemoglobin 13.0 - 17.0 g/dL 12.5(L) 12.2(L) 12.0(L)  Hematocrit 39.0 - 52.0 % 36.4(L) 35.0(L) 34.3(L)  Platelets 150 - 400 K/uL 183 173 151   CSF arbovirus panel pending CSF enterovirus PCR pending RMSF serologies  pending  A/P:   1. Aseptic meningitis: Most consistent with viral meningitis. Workup thus far unrevealing but still waiting for enterovirus PCR and arbovirus panel. He is doing much better today. Continue supportive care.  2. Headache: He has had a severe and refractory headache from his meningitis. Today he is doing much better, however. Continue scheduled ibuprofen. Continue amitriptyline. I would recommend that he continue the scheduled ibuprofen until he has had several consecutive days of good pain relief, after which he can start taking it on a PRN basis. He should continue the amitriptyline until he is able to go without any ibuprofen or other acute analgesia then this can be discontinued as well. If he has adequate pain control and sleep on his current regimen then there is no need to increase his amitriptyline dose. He was advised to stay hydrated. He is eager to return to work and normal activities but was cautioned against doing so right away as this could cause a relapse of his headache. He was encouraged to take a couple of days off to allow further recovery and then gradually reintroduce activities as tolerated.   This was discussed with the patient and his wife. They are in agreement with the plan as noted. They were given the opportunity to ask any questions and these were addressed to their satisfaction. I have no additional recommendations at this time and will sign off. Call if any new issues arise.   Rhona Leavensimothy Oster, MD [facility]

## 2015-10-30 NOTE — Discharge Instructions (Signed)
You were admitted for meningitis. You were seen by neurology and it seems that this is likely viral meningitis. However until all of the studies return, we will send you home with Doxycyline to cover for [place] spotted fever. You also have Ibuprofen and Oxycodone to take for pain.  - Neurology recommended taking Ibuprofen scheduled (every 6 hours) for the next FEW days until you have good relief from your pain, then you can go to an as needed basis. Try to take this with some food to avoid stomach irritation. - The neurologist also started you on amitriptyline. They recommend that you continue this until you are able to go without any ibuprofen or oxycodone. (you should touch base with your primary care provider before stopping this medication.  - Please keep hydrated; this will help with headaches - You also have anti-nausea medication.  - Neurology cautioned against starting work right away because this may cause your headache to return. Please take a couple of days off of work so that you can continue to get better.  - Please make a follow up with your primary care provider as soon as possible for next week. - you will complete 6 more days of Doxycycline; your first home dose will be 8/6 in the evening.

## 2015-10-30 NOTE — Progress Notes (Signed)
Family Medicine Teaching Service Daily Progress Note Intern Pager: [phone]  Patient name: Alexander PhiRichard B Gross Medical record number: [id number] Date of birth: Oct 11, 1960 Age: 55 y.o. Gender: male  Primary Care Provider: Katy ApoPOLITE,RONALD D, MD Consultants: Neurology Code Status: FULL  Pt Overview and Major Events to Date:  10/26/2015 Pt admitted to FPTS 10/28/15: Discontinued Amp, Vanc, and CTX per neuro given non evidence of bacterial meningitis 10/29/15: transition to PO Doxycycline pending titer results   Assessment and Plan: Alexander FruitsRichard B Hardyis a 55yo male presenting with progressively worsening headache, neck stiffness and fever to 103.4, found to have CSF studies likely consistent with viral meningitis. PMHx of smokeless tobacco and chronic sinusitis.  #Likely Viral Meningitis: CSF with elevated protein and WBC (neutrophils) gram stain no organisms, CSF culture no growth x3d.   HIV nonreactive, Serum Lyme Abs negative, CSF HSV negative. Blood Culture no growth x 3d - Discontinued Ceftriaxone, Vancomycin and ampicillin (8/4) given no evidence of bacterial meningitis - PO Doxycycline (Day #5) for RMSF coverage pending titer results; will discharge home with doxycycline (total 10 days) to cover empirically for RMSF until studies return.  - f/u fungal cx, enterovirus cx, arbovirus cx, RMSF titers, csf lyme titers  - cont droplet precuation - appreciate neurology recs: recommended scheduled Ibuprofen for few days then go to PRN basis. Continue Amitriptyline until pt does not require Ibuprofen or Oxy IR for pain. Take a couple of days off work to allow further recovery. Continue to stay hydrated.   #Nausea/Headache in the setting of viral meningitis, controlled/resolved: Controlled on current regimen. HA and Nausea controlled on meds  - cont PRN reglan, zofran - Ibuprofen 600 mg q4h and oxycodone 5 mg q4h - amitriptyline 25 mg qhs at bedtime added per neurology recs: This can be increased by 25 mg  each week to 75-100 mg daily as needed. (started 10/28/15)  #Chronic sinusitis - Stable.No clear nasal drainage, suggesting against CSF leak.  - Continue Claritin and Guifenesin if necessary  FEN/GI: clear fluid diet, 1225mL/hr 0.9% NS PPx: lovenox  Disposition: stable, likely discharge today   Subjective:   Doing well this AM. No HA or nausea; controlled by pain medications. Ate dinner yesterday and breakfast this AM without any issues. No neck pain/stiffness.   Objective: Temp:  [97.8 F (36.6 C)-99.1 F (37.3 C)] 97.8 F (36.6 C) (08/06 0551) Pulse Rate:  [70-90] 72 (08/06 0551) Resp:  [16-18] 18 (08/06 0551) BP: (145-154)/(87-92) 152/92 (08/06 0551) SpO2:  [98 %-99 %] 99 % (08/06 0551) Weight:  [73.5 kg (162 lb)] 73.5 kg (162 lb) (08/05 1500) Physical Exam: General: no apparent distress, sitting up   Cardiovascular: RRR, no murmurs rubs or gallops Respiratory: clear to auscultation bilaterally, no W./R/R Abdomen: soft, nontender, normoactive BS Extremities: full ROM if 4 extremities,warm and well-perfused with no edema NEURO:  no nuchal rigidity, AAOx3  Laboratory:  Recent Labs Lab 10/28/15 0311 10/29/15 0452 10/30/15 0606  WBC 7.1 5.2 5.1  HGB 12.0* 12.2* 12.5*  HCT 34.3* 35.0* 36.4*  PLT 151 173 183    Recent Labs Lab 10/28/15 0311 10/29/15 0452 10/30/15 0606  NA 132* 135 133*  K 3.6 3.7 3.9  CL 100* 101 98*  CO2 23 26 28   BUN 6 <5* 6  CREATININE 0.74 0.71 0.77  CALCIUM 8.3* 8.7* 8.7*  GLUCOSE 106* 99 100*     Palma HolterKanishka G Gross Warth, MD 10/30/2015, 9:26 AM PGY-2, [facility] FPTS Intern pager: [phone], text pages welcome

## 2015-10-31 LAB — ROCKY MTN SPOTTED FVR ABS PNL(IGG+IGM)
RMSF IgG: NEGATIVE
RMSF IgM: 0.29 index (ref 0.00–0.89)

## 2015-10-31 LAB — ENTEROVIRUS PCR: Enterovirus PCR: NEGATIVE

## 2015-10-31 LAB — CULTURE, BLOOD (ROUTINE X 2)
CULTURE: NO GROWTH
CULTURE: NO GROWTH

## 2015-10-31 NOTE — Discharge Summary (Signed)
Family Medicine Teaching [facility] Discharge Summary  Patient name: Alexander Gross Medical record number: [id number] Date of birth: 1960/09/21 Age: 55 y.o. Gender: male Date of Admission: 10/26/2015  Date of Discharge: 10/30/2015 Admitting Physician: Moses MannersWilliam A Hensel, MD  Primary Care Provider: Katy ApoPOLITE,RONALD D, MD Consultants: Neurology  Indication for Hospitalization: meningitis   Discharge Diagnoses/Problem List:  Meningitis, likely viral  Disposition: Discharge home  Discharge Condition: Stable, return to normal activity gradually as tolerated  Discharge Exam:  General: no apparent distress, sitting up                  Cardiovascular: RRR, no murmurs rubs or gallops Respiratory: clear to auscultation bilaterally, no W./R/R Abdomen: soft, nontender, normoactive BS Extremities: full ROM if 4 extremities,warm and well-perfused with no edema NEURO:  no nuchal rigidity, AAOx3   Brief Hospital Course:  [facility], [facility] and photophobia, found to have elevated protein and increased WBC(neutrophils) on lumbar puncture, most concerning for viral meningitis.  Rocky mountain spotted fever meningitis was also considered and empirically treated with doxycycline.  At first patient was treated empirically for bacterial meningitis with ampicillin, vancomycin and rocephin, which were discontinued with CSF cultures 2d no growth. Other meningitis workup including HSV, HIV, Lyme, and enterovirus cultures were negative. Patient was symptomatically treated for pain with oxycodone IR and ibuprofen with morphine for breakthrough pain as well as nausea control with reglan and zofran.   Issues for Follow Up:  1. Follow up viral cultures -  Arbovirus panel pending at discharge 2. Continue Doxycycline pending RMSF titers 3. Pain control with scheduled oxy IR and ibuprofen   Significant Procedures: Lumbar puncture, head CT  Significant Labs and Imaging:    Recent Labs Lab 10/28/15 0311 10/29/15 0452 10/30/15 0606  WBC 7.1 5.2 5.1  HGB 12.0* 12.2* 12.5*  HCT 34.3* 35.0* 36.4*  PLT 151 173 183    Recent Labs Lab 10/26/15 1218 10/26/15 2100 10/27/15 0422 10/28/15 0311 10/29/15 0452 10/30/15 0606  NA 133*  --  132* 132* 135 133*  K 4.1  --  4.4 3.6 3.7 3.9  CL 99*  --  102 100* 101 98*  CO2  --   --  21* 23 26 28   GLUCOSE 102*  --  98 106* 99 100*  BUN 7  --  8 6 <5* 6  CREATININE 0.90 0.93 0.81 0.74 0.71 0.77  CALCIUM  --   --  8.3* 8.3* 8.7* 8.7*      Results/Tests Pending at Time of Discharge: Arbovirus PCR  Discharge Medications:    Medication List    STOP taking these medications   amoxicillin-clavulanate 875-125 MG tablet Commonly known as:  AUGMENTIN   doxycycline 100 MG capsule Commonly known as:  VIBRAMYCIN Replaced by:  doxycycline 100 MG tablet   HYDROcodone-acetaminophen 5-325 MG tablet Commonly known as:  NORCO/VICODIN     TAKE these medications   acetaminophen 500 MG tablet Commonly known as:  TYLENOL Take 1,000 mg by mouth every 4 (four) hours as needed for fever.   amitriptyline 25 MG tablet Commonly known as:  ELAVIL Take 1 tablet (25 mg total) by mouth at bedtime.   doxycycline 100 MG tablet Commonly known as:  VIBRA-TABS Take 1 tablet (100 mg total) by mouth 2 (two) times daily. Replaces:  doxycycline 100 MG capsule   Guaifenesin 1200 MG Tb12 Commonly known as:  MUCINEX MAXIMUM STRENGTH Take 1 tablet (1,200 mg total) by mouth 2 (  two) times daily.   ibuprofen 600 MG tablet Commonly known as:  ADVIL,MOTRIN Take 1 tablet (600 mg total) by mouth every 6 (six) hours as needed for headache. What changed:  medication strength  how much to take  when to take this  reasons to take this   loratadine 10 MG tablet Commonly known as:  CLARITIN Take 10 mg by mouth daily.   ondansetron 4 MG tablet Commonly known as:  ZOFRAN Take 1 tablet (4 mg total) by mouth every 8 (eight)  hours as needed for nausea or vomiting.   oxyCODONE 5 MG immediate release tablet Commonly known as:  Oxy IR/ROXICODONE Take 1 tablet (5 mg total) by mouth every 6 (six) hours.       Discharge Instructions: Please refer to Patient Instructions section of EMR for full details.  Patient was counseled important signs and symptoms that should prompt return to medical care, changes in medications, dietary instructions, activity restrictions, and follow up appointments.   Follow-Up Appointments: Follow-up Information    Katy Apo, MD .   Specialty:  Internal Medicine Why:  Please make a follow up appointment for next week. (For hospital follow up visit)  Contact information: [address] [place] [address] [phone]           Howard Pouch, MD 10/31/2015, 1:53 PM PGY-1, [facility] Family [facility]

## 2015-11-01 ENCOUNTER — Telehealth: Payer: Self-pay | Admitting: Family Medicine

## 2015-11-01 NOTE — Telephone Encounter (Signed)
Attempted to contact patient with lab results, no answer. Will call again at later time.

## 2015-11-02 NOTE — Telephone Encounter (Signed)
Attempted to call again with no answer.

## 2015-11-03 LAB — B. BURGDORFI ANTIBODIES, CSF

## 2015-11-04 ENCOUNTER — Encounter: Payer: Self-pay | Admitting: Family Medicine

## 2015-11-04 NOTE — Telephone Encounter (Signed)
Attempted to contact patient, no answer, will send letter with results.

## 2015-11-07 DIAGNOSIS — G030 Nonpyogenic meningitis: Secondary | ICD-10-CM | POA: Diagnosis not present

## 2015-11-10 LAB — ARBOVIRUS PANEL, ~~LOC~~ LAB

## 2015-11-15 DIAGNOSIS — K219 Gastro-esophageal reflux disease without esophagitis: Secondary | ICD-10-CM | POA: Insufficient documentation

## 2016-04-06 DIAGNOSIS — R05 Cough: Secondary | ICD-10-CM | POA: Diagnosis not present

## 2016-04-06 DIAGNOSIS — K219 Gastro-esophageal reflux disease without esophagitis: Secondary | ICD-10-CM | POA: Diagnosis not present

## 2016-04-06 DIAGNOSIS — Z720 Tobacco use: Secondary | ICD-10-CM | POA: Diagnosis not present

## 2016-04-06 DIAGNOSIS — Z716 Tobacco abuse counseling: Secondary | ICD-10-CM | POA: Diagnosis not present

## 2017-01-14 DIAGNOSIS — L719 Rosacea, unspecified: Secondary | ICD-10-CM | POA: Diagnosis not present

## 2017-09-01 DIAGNOSIS — Z23 Encounter for immunization: Secondary | ICD-10-CM | POA: Diagnosis not present

## 2017-09-02 DIAGNOSIS — S81811A Laceration without foreign body, right lower leg, initial encounter: Secondary | ICD-10-CM | POA: Diagnosis not present

## 2017-09-02 DIAGNOSIS — L03115 Cellulitis of right lower limb: Secondary | ICD-10-CM | POA: Diagnosis not present

## 2018-02-25 DIAGNOSIS — L719 Rosacea, unspecified: Secondary | ICD-10-CM | POA: Diagnosis not present

## 2018-04-08 DIAGNOSIS — R51 Headache: Secondary | ICD-10-CM | POA: Diagnosis not present

## 2018-04-08 DIAGNOSIS — J069 Acute upper respiratory infection, unspecified: Secondary | ICD-10-CM | POA: Diagnosis not present

## 2019-02-02 DIAGNOSIS — I10 Essential (primary) hypertension: Secondary | ICD-10-CM | POA: Diagnosis not present

## 2019-02-02 DIAGNOSIS — Z23 Encounter for immunization: Secondary | ICD-10-CM | POA: Diagnosis not present

## 2019-02-02 DIAGNOSIS — Z1211 Encounter for screening for malignant neoplasm of colon: Secondary | ICD-10-CM | POA: Diagnosis not present

## 2019-02-02 DIAGNOSIS — Z125 Encounter for screening for malignant neoplasm of prostate: Secondary | ICD-10-CM | POA: Diagnosis not present

## 2019-02-02 DIAGNOSIS — R519 Headache, unspecified: Secondary | ICD-10-CM | POA: Diagnosis not present

## 2019-03-06 ENCOUNTER — Encounter: Payer: Self-pay | Admitting: Neurology

## 2019-03-09 ENCOUNTER — Other Ambulatory Visit: Payer: Self-pay

## 2019-03-09 ENCOUNTER — Encounter: Payer: Self-pay | Admitting: Neurology

## 2019-03-09 ENCOUNTER — Telehealth (INDEPENDENT_AMBULATORY_CARE_PROVIDER_SITE_OTHER): Payer: BC Managed Care – PPO | Admitting: Neurology

## 2019-03-09 VITALS — Ht 68.0 in | Wt 170.0 lb

## 2019-03-09 DIAGNOSIS — R519 Headache, unspecified: Secondary | ICD-10-CM

## 2019-03-09 DIAGNOSIS — G43109 Migraine with aura, not intractable, without status migrainosus: Secondary | ICD-10-CM

## 2019-03-09 NOTE — Progress Notes (Signed)
Virtual Visit via Video Note The purpose of this virtual visit is to provide medical care while limiting exposure to the novel coronavirus.    Consent was obtained for video visit:  Yes.   Answered questions that patient had about telehealth interaction:  Yes.   I discussed the limitations, risks, security and privacy concerns of performing an evaluation and management service by telemedicine. I also discussed with the patient that there may be a patient responsible charge related to this service. The patient expressed understanding and agreed to proceed.  Pt location: Home Physician Location: office Name of referring provider:  Renford DillsPolite, Ronald, MD I connected with Dessa Phiichard B Kuklinski at patients initiation/request on 03/09/2019 at  7:50 AM EST by video enabled telemedicine application and verified that I am speaking with the correct person using two identifiers. Pt MRN:  [id number] Pt DOB:  02-May-1960 Video Participants:  Dessa Phiichard B Cranston   History of Present Illness:  Alexander Gross is a 58 year old male with HTN, osteoarthrosis, GERD and history of meningitis (August 2017) who presents for headaches.  History supplemented by referring provider note.  He started experiencing visual disturbance 12 months ago.  He sees brown sharp floaters with sharp perimeter lasting 15 to 20 minutes followed by onset of mild bifrontal dull persistent headache all day, sometimes into the following morning.  He has had 8 in past year (3 in afternoon and 5 in the morning.  He has associated photophobia but denies nausea, vomiting, phonophobia.  No associated slurred speech or unilateral numbness or weakness.  He has not identified any trigger.  He has taken Advil which may dull the pain but not abort it.  Last episode was about a month ago.    He reports maybe a prior history of 2 migraines in his life.  His mother had history of severe migraines.     Current NSAIDS:  ibuprofen Current analgesics:   none Current triptans:  none Current ergotamine:  none Current anti-emetic:  none Current muscle relaxants:  none Current anti-anxiolytic:  none Current sleep aide:  none Current Antihypertensive medications:  losartan Current Antidepressant medications:  none Current Anticonvulsant medications:  none Current anti-CGRP:  none Current Vitamins/Herbal/Supplements:  none Current Antihistamines/Decongestants:  Claritin Other therapy:  none Hormone/birth control:  none  Past NSAIDS:  none Past analgesics:  Tylenol Past abortive triptans:  none Past abortive ergotamine:  none Past muscle relaxants:  none Past anti-emetic:  none Past antihypertensive medications:  none Past antidepressant medications:  none Past anticonvulsant medications:  none Past anti-CGRP:  none Past vitamins/Herbal/Supplements:  none Past antihistamines/decongestants:  none Other past therapies:  none  Caffeine:  1.5 cups of coffee daily.  2 Cokes 5 days a week. Alcohol:  12 beers a week Smoker:  nonsmoker Diet:  32 oz water daily.  Eats 3 meals daily.  Eats sausage biscuit for breakfast. Exercise:  walks Depression:  no; Anxiety:  no Other pain:  Mild aches and pains Sleep hygiene:  good  02/02/2019 LABS: CMP with NA 134, K+ 4.4, CL 97, CO2 26, glucose 83, BUN 6, CR 0.82, T bili 1, ALP 42, AST 29, ALT 25; TSH 0.64, Lyme Western blot negative.  Past Medical History: Past Medical History:  Diagnosis Date  . GERD (gastroesophageal reflux disease)   . Headache    "daily since last Saturday" (10/27/2015)  . Tick bite ~ 08/2015   bitten by multiple ticks about two months ago when hiking in [place]/notes 10/26/2015  Medications: Outpatient Encounter Medications as of 03/09/2019  Medication Sig  . acetaminophen (TYLENOL) 500 MG tablet Take 1,000 mg by mouth every 4 (four) hours as needed for fever.   . doxycycline (DORYX) 100 MG EC tablet Take 100 mg by mouth daily.  Marland Kitchen losartan (COZAAR) 50 MG tablet Take 50 mg  by mouth daily.  Marland Kitchen ibuprofen (ADVIL,MOTRIN) 600 MG tablet Take 1 tablet (600 mg total) by mouth every 6 (six) hours as needed for headache. (Patient not taking: Reported on 03/06/2019)  . loratadine (CLARITIN) 10 MG tablet Take 10 mg by mouth daily.  . [DISCONTINUED] amitriptyline (ELAVIL) 25 MG tablet Take 1 tablet (25 mg total) by mouth at bedtime.  . [DISCONTINUED] ondansetron (ZOFRAN) 4 MG tablet Take 1 tablet (4 mg total) by mouth every 8 (eight) hours as needed for nausea or vomiting.  . [DISCONTINUED] oxyCODONE (OXY IR/ROXICODONE) 5 MG immediate release tablet Take 1 tablet (5 mg total) by mouth every 6 (six) hours.   No facility-administered encounter medications on file as of 03/09/2019.    Allergies: No Known Allergies  Family History: Family History  Problem Relation Age of Onset  . Heart disease Mother   . Migraines Mother   . Cancer Father     Social History: Social History   Socioeconomic History  . Marital status: Married    Spouse name: Not on file  . Number of children: Not on file  . Years of education: Not on file  . Highest education level: Not on file  Occupational History  . Not on file  Tobacco Use  . Smoking status: Never Smoker  . Smokeless tobacco: Current User    Types: Snuff  Substance and Sexual Activity  . Alcohol use: Yes    Alcohol/week: 12.0 standard drinks    Types: 12 Cans of beer per week  . Drug use: No  . Sexual activity: Not Currently    Partners: Female  Other Topics Concern  . Not on file  Social History Narrative   Right handed   Lives in a two story home with wife.   Social Determinants of Health   Financial Resource Strain:   . Difficulty of Paying Living Expenses: Not on file  Food Insecurity:   . Worried About Charity fundraiser in the Last Year: Not on file  . Ran Out of Food in the Last Year: Not on file  Transportation Needs:   . Lack of Transportation (Medical): Not on file  . Lack of Transportation  (Non-Medical): Not on file  Physical Activity:   . Days of Exercise per Week: Not on file  . Minutes of Exercise per Session: Not on file  Stress:   . Feeling of Stress : Not on file  Social Connections:   . Frequency of Communication with Friends and Family: Not on file  . Frequency of Social Gatherings with Friends and Family: Not on file  . Attends Religious Services: Not on file  . Active Member of Clubs or Organizations: Not on file  . Attends Archivist Meetings: Not on file  . Marital Status: Not on file  Intimate Partner Violence:   . Fear of Current or Ex-Partner: Not on file  . Emotionally Abused: Not on file  . Physically Abused: Not on file  . Sexually Abused: Not on file    Observations/Objective:   Height 5\' 8"  (1.727 m), weight 170 lb (77.1 kg). No acute distress.  Alert and oriented.  Speech fluent and  not dysarthric.  Language intact.  Eyes orthophoric on primary gaze.  Face symmetric.  Assessment and Plan:   Semiology consistent with migraine with aura, without status migrainosus, not intractable.  However, given no prior history of significant migraines, and that onset of these headaches have occurred after age 58, will check MRI of brain with and without contrast.  Further recommendations pending results.  As of now, they are manageable and not frequent.  Therefore, I would not prescribe any medications (preventative or abortive) at this time.  If they should become more frequent or severe, requiring pharmacologic intervention, he will follow up with me.  Follow Up Instructions:    -I discussed the assessment and treatment plan with the patient. The patient was provided an opportunity to ask questions and all were answered. The patient agreed with the plan and demonstrated an understanding of the instructions.   The patient was advised to call back or seek an in-person evaluation if the symptoms worsen or if the condition fails to improve as  anticipated.     Cira Servant, DO

## 2019-03-09 NOTE — Addendum Note (Signed)
Addended by: Ranae Plumber on: 03/09/2019 08:33 AM   Modules accepted: Orders

## 2019-04-11 ENCOUNTER — Other Ambulatory Visit: Payer: Self-pay

## 2019-04-11 ENCOUNTER — Ambulatory Visit
Admission: RE | Admit: 2019-04-11 | Discharge: 2019-04-11 | Disposition: A | Discharge status: Home / Self Care | Payer: BC Managed Care – PPO | Source: Ambulatory Visit | Attending: Neurology | Admitting: Neurology

## 2019-04-11 DIAGNOSIS — G43109 Migraine with aura, not intractable, without status migrainosus: Secondary | ICD-10-CM

## 2019-04-11 DIAGNOSIS — R519 Headache, unspecified: Secondary | ICD-10-CM | POA: Diagnosis not present

## 2019-04-11 IMAGING — MR MR HEAD WO/W CM
12 series · 48 of 48 positions shown · IV contrast (15ml Multihance)
Comparison: Head CT [DATE]

CLINICAL DATA: New onset headaches and visual disturbance over the
last year.

EXAM:
MRI HEAD WITHOUT AND WITH CONTRAST
TECHNIQUE: Multiplanar, multiecho pulse sequences of the brain and surrounding
structures were obtained without and with intravenous contrast.
CONTRAST:  15mL MULTIHANCE GADOBENATE DIMEGLUMINE 529 MG/ML IV SOLN

[Series 5: T1 · sagittal · 4.0mm · 0.75mm/px · 1 of 31 slices shown (1 of 3)]
[im 1/31]
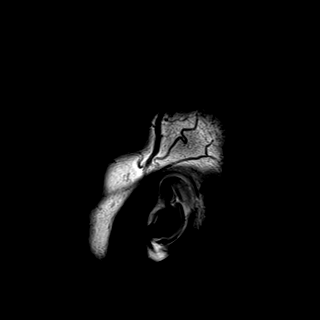

[Series 6: DWI · axial · 3.0mm · 1.44mm/px · z∈[-70,+82]mm · 6 of 96 slices shown (1 of 4)]
[im 1/96]
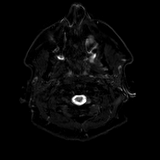
[im 20/96]
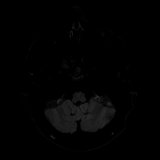
[im 39/96]
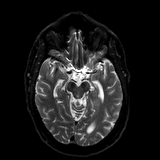
[im 58/96]
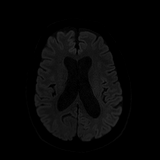
[im 77/96]
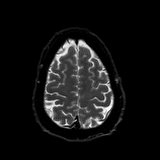
[im 96/96]
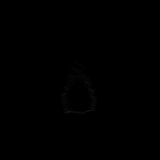

[Series 7: DWI · axial · 3.0mm · 1.44mm/px · z∈[-70,+82]mm · 3 of 45 slices shown (2 of 4)]
[im 1/45]
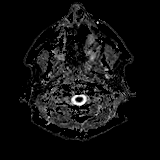
[im 23/45]
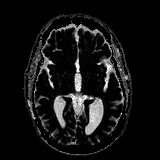
[im 45/45]
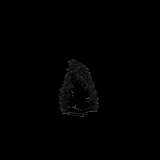

[Series 8: DWI · coronal · 5.0mm · 1.44mm/px · 4 of 68 slices shown (3 of 4)]
[im 1/68]
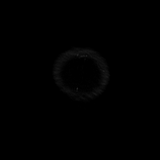
[im 23/68]
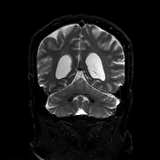
[im 45/68]
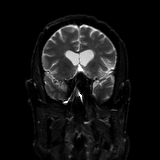
[im 68/68]
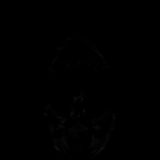

[Series 9: DWI · coronal · 5.0mm · 1.44mm/px · 2 of 34 slices shown (4 of 4)]
[im 1/34]
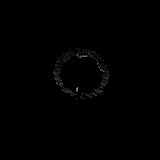
[im 34/34]
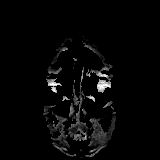

[Series 10: T2 · axial · 4.0mm · 0.36mm/px · z∈[-66,+82]mm · 2 of 30 slices shown]
[im 1/30]
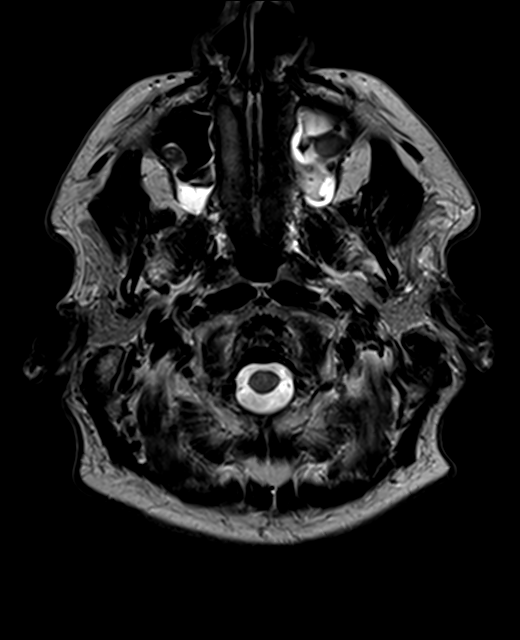
[im 30/30]
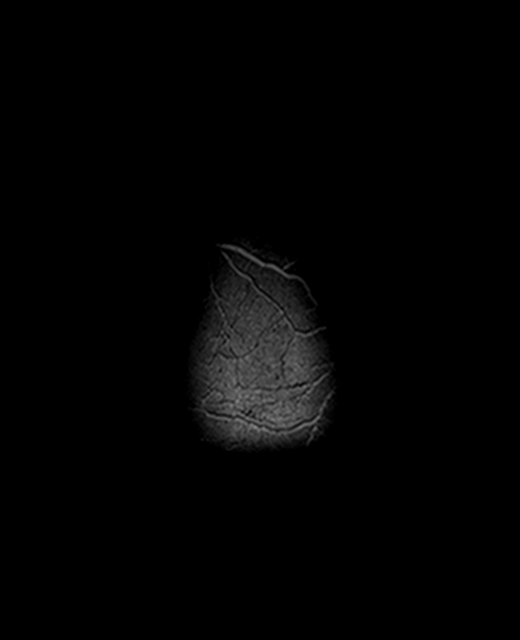

[Series 11: FLAIR · axial · 3.0mm · 0.72mm/px · z∈[-69,+79]mm · 2 of 26 slices shown]
[im 1/26]
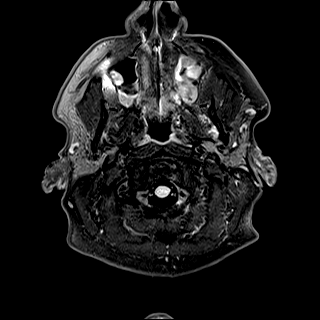
[im 26/26]
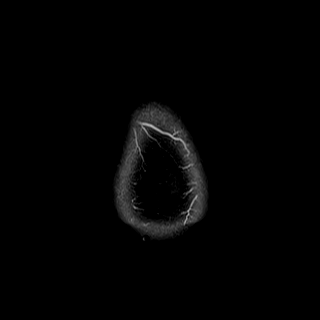

[Series 13: swi_images · axial · 1.5mm · 0.90mm/px · z∈[-62,+78]mm · 6 of 96 slices shown]
[im 1/96]
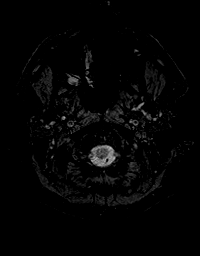
[im 20/96]
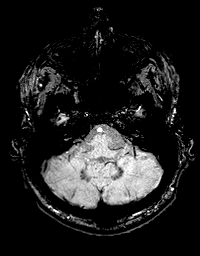
[im 39/96]
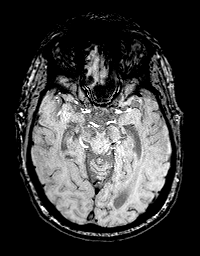
[im 58/96]
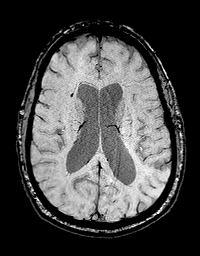
[im 77/96]
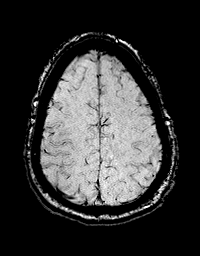
[im 96/96]
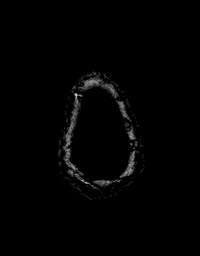

[Series 14: T1 · axial · 1.0mm · 0.94mm/px · z∈[-81,+76]mm · 9 of 160 slices shown (2 of 3)]
[im 1/160]
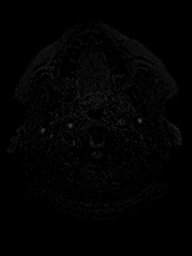
[im 20/160]
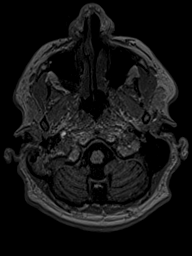
[im 40/160]
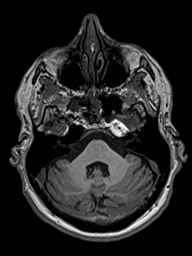
[im 60/160]
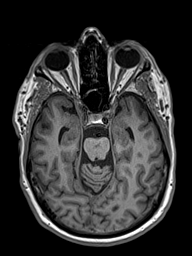
[im 80/160]
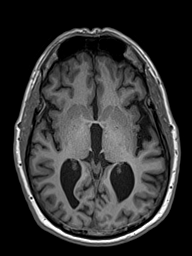
[im 100/160]
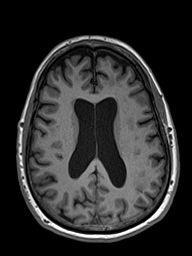
[im 120/160]
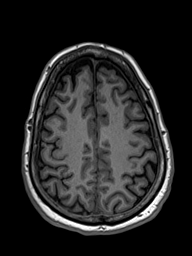
[im 140/160]
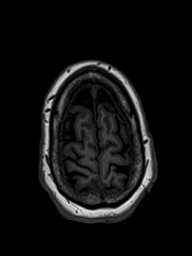
[im 160/160]
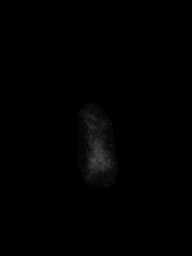

[Series 15: T2 post-contrast · coronal · 4.0mm · 0.36mm/px · 2 of 35 slices shown]
[im 1/35]
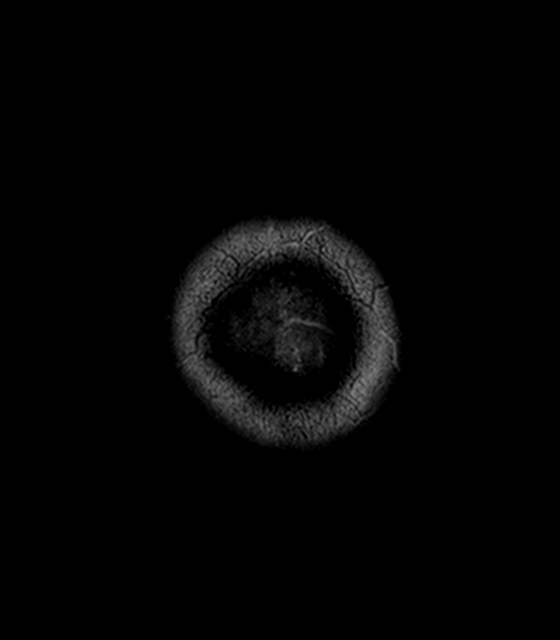
[im 35/35]
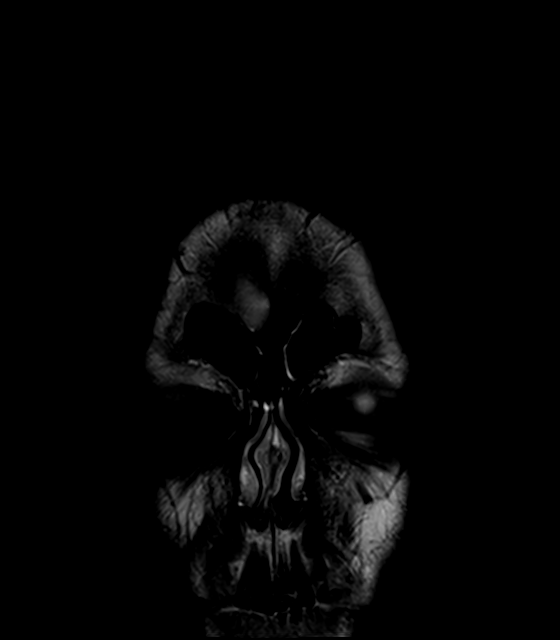

[Series 16: T1 · axial · 1.0mm · 0.94mm/px · z∈[-81,+76]mm · 9 of 160 slices shown (3 of 3)]
[im 1/160]
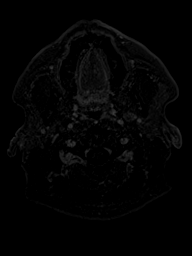
[im 20/160]
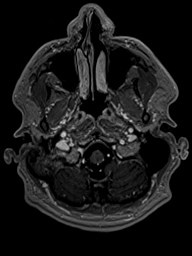
[im 40/160]
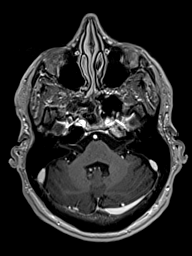
[im 60/160]
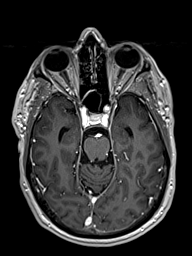
[im 80/160]
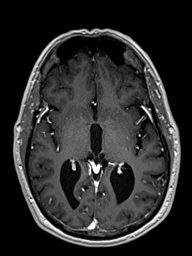
[im 100/160]
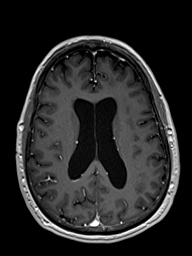
[im 120/160]
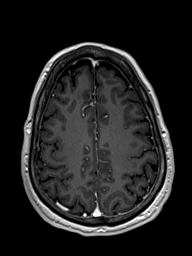
[im 140/160]
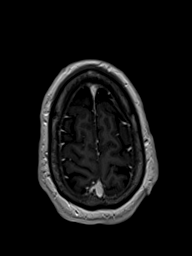
[im 160/160]
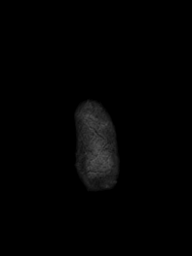

[Series 17: T1 post-contrast · coronal · 4.0mm · 0.72mm/px · 2 of 35 slices shown]
[im 1/35]
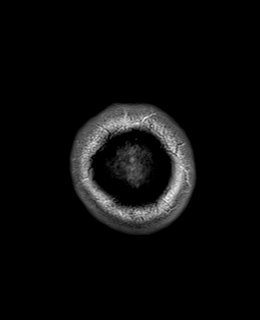
[im 35/35]
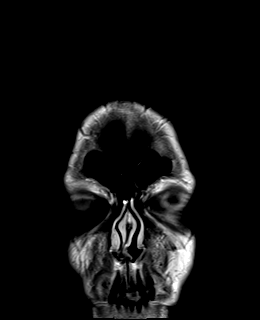

[48 of 48 positions shown; findings below may reference images not displayed]

FINDINGS: Brain: Diffusion imaging does not show any acute or subacute
infarction. The brainstem and cerebellum are normal. Within the
cerebral hemispheres, there are scattered typical small foci of T2
and FLAIR signal within the white matter consistent with small
vessel change. There are other areas of slightly larger more
confluent T2 and FLAIR signal within the hemispheric white matter,
most notable in both frontal lobes and in the left parietal lobe. No
cortical infarction. After contrast administration, no abnormal
enhancement occurs. No evidence of mass, hemorrhage, hydrocephalus
or extra-axial collection.

Vascular: Major vessels at the base of the brain show flow.

Skull and upper cervical spine: Negative

Sinuses/Orbits: Mucosal inflammatory changes of the sphenoid sinus
which could be a cause of headache. Mucosal inflammation also of the
maxillary sinuses. Orbits negative.

Other: None
IMPRESSION: No acute intracranial finding. Small vessel ischemic changes of the
cerebral hemispheric white matter. Some of these changes are fairly
confluent, but no other etiology is suggested.

Mucosal inflammatory changes of the sphenoid and maxillary sinuses.
Could this relate to headache?

## 2019-04-11 MED ORDER — GADOBENATE DIMEGLUMINE 529 MG/ML IV SOLN
15.0000 mL | Freq: Once | INTRAVENOUS | Status: AC | PRN
  Administered 2019-04-11: 15 mL via INTRAVENOUS

## 2019-04-13 ENCOUNTER — Telehealth: Payer: Self-pay

## 2019-04-13 NOTE — Telephone Encounter (Signed)
Pt called back went over MRI results, pt verbalized understanding

## 2019-04-13 NOTE — Telephone Encounter (Signed)
Voce mail left for pt to call back to go over MRI results  MRI of brain shows changes in brain seen with age and history of high blood pressure. No tumor, stroke or other acute intracranial abnormality noted.

## 2019-06-02 DIAGNOSIS — Z1159 Encounter for screening for other viral diseases: Secondary | ICD-10-CM | POA: Diagnosis not present

## 2019-06-05 DIAGNOSIS — D125 Benign neoplasm of sigmoid colon: Secondary | ICD-10-CM | POA: Diagnosis not present

## 2019-06-05 DIAGNOSIS — Z1211 Encounter for screening for malignant neoplasm of colon: Secondary | ICD-10-CM | POA: Diagnosis not present

## 2019-08-14 DIAGNOSIS — L570 Actinic keratosis: Secondary | ICD-10-CM | POA: Diagnosis not present

## 2019-08-14 DIAGNOSIS — L719 Rosacea, unspecified: Secondary | ICD-10-CM | POA: Diagnosis not present

## 2019-09-01 DIAGNOSIS — H5711 Ocular pain, right eye: Secondary | ICD-10-CM | POA: Diagnosis not present

## 2019-10-12 DIAGNOSIS — H6123 Impacted cerumen, bilateral: Secondary | ICD-10-CM | POA: Diagnosis not present

## 2019-10-27 DIAGNOSIS — Z720 Tobacco use: Secondary | ICD-10-CM | POA: Diagnosis not present

## 2019-10-27 DIAGNOSIS — R868 Other abnormal findings in specimens from male genital organs: Secondary | ICD-10-CM | POA: Diagnosis not present

## 2019-11-25 DIAGNOSIS — R361 Hematospermia: Secondary | ICD-10-CM | POA: Diagnosis not present

## 2019-11-25 DIAGNOSIS — Z125 Encounter for screening for malignant neoplasm of prostate: Secondary | ICD-10-CM | POA: Diagnosis not present

## 2020-02-25 ENCOUNTER — Ambulatory Visit
Admission: RE | Admit: 2020-02-25 | Discharge: 2020-02-25 | Disposition: A | Discharge status: Home / Self Care | Payer: BC Managed Care – PPO | Source: Ambulatory Visit | Attending: Internal Medicine | Admitting: Internal Medicine

## 2020-02-25 ENCOUNTER — Other Ambulatory Visit: Payer: Self-pay | Admitting: Internal Medicine

## 2020-02-25 DIAGNOSIS — I10 Essential (primary) hypertension: Secondary | ICD-10-CM | POA: Diagnosis not present

## 2020-02-25 DIAGNOSIS — R0609 Other forms of dyspnea: Secondary | ICD-10-CM | POA: Diagnosis not present

## 2020-02-25 DIAGNOSIS — R0600 Dyspnea, unspecified: Secondary | ICD-10-CM

## 2020-02-25 IMAGING — CR DG CHEST 2V
2 series · 2 of 2 positions shown · non-contrast
Comparison: [DATE]

CLINICAL DATA: Dyspnea on exertion.

EXAM:
CHEST - 2 VIEW

[w chest pa]
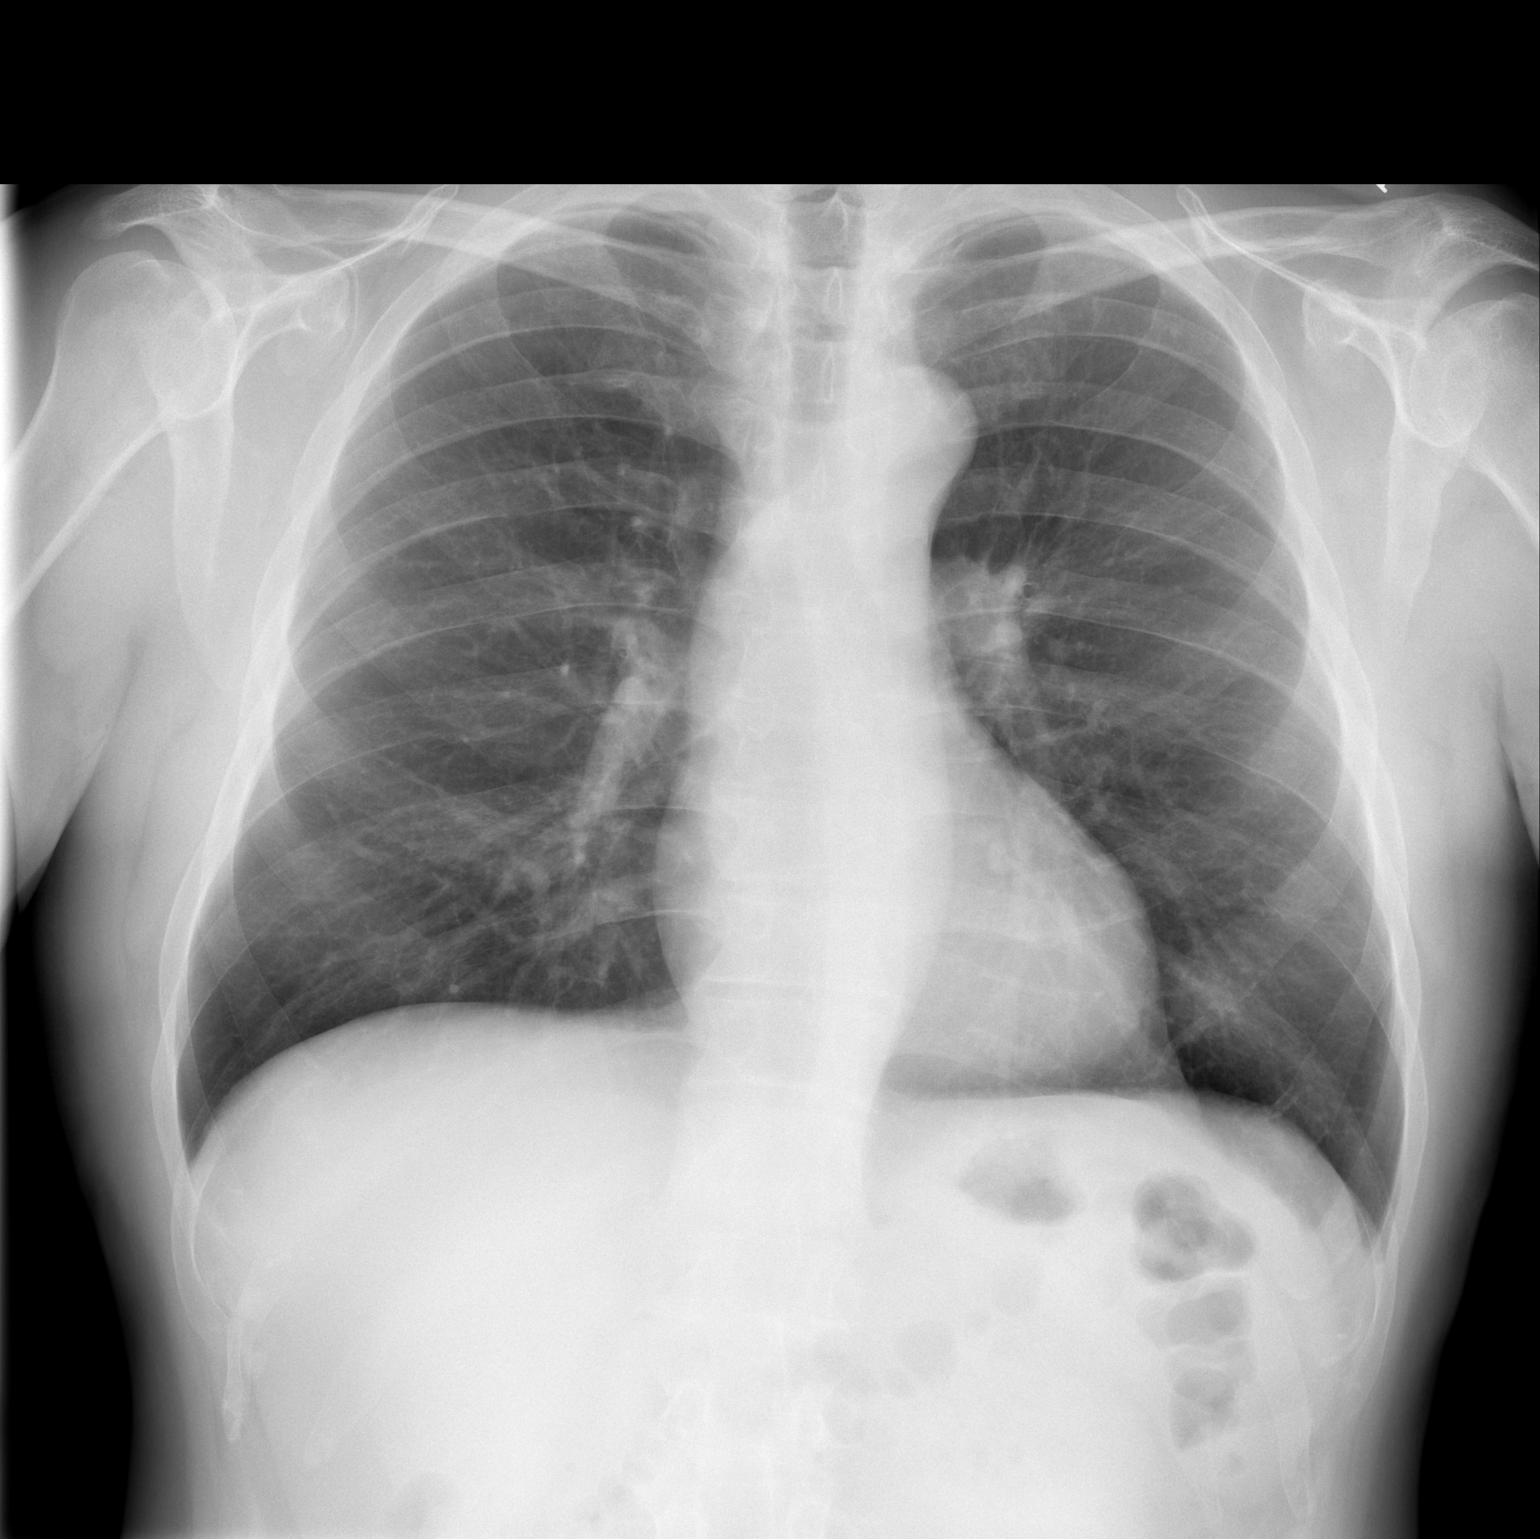

[w chest lat]
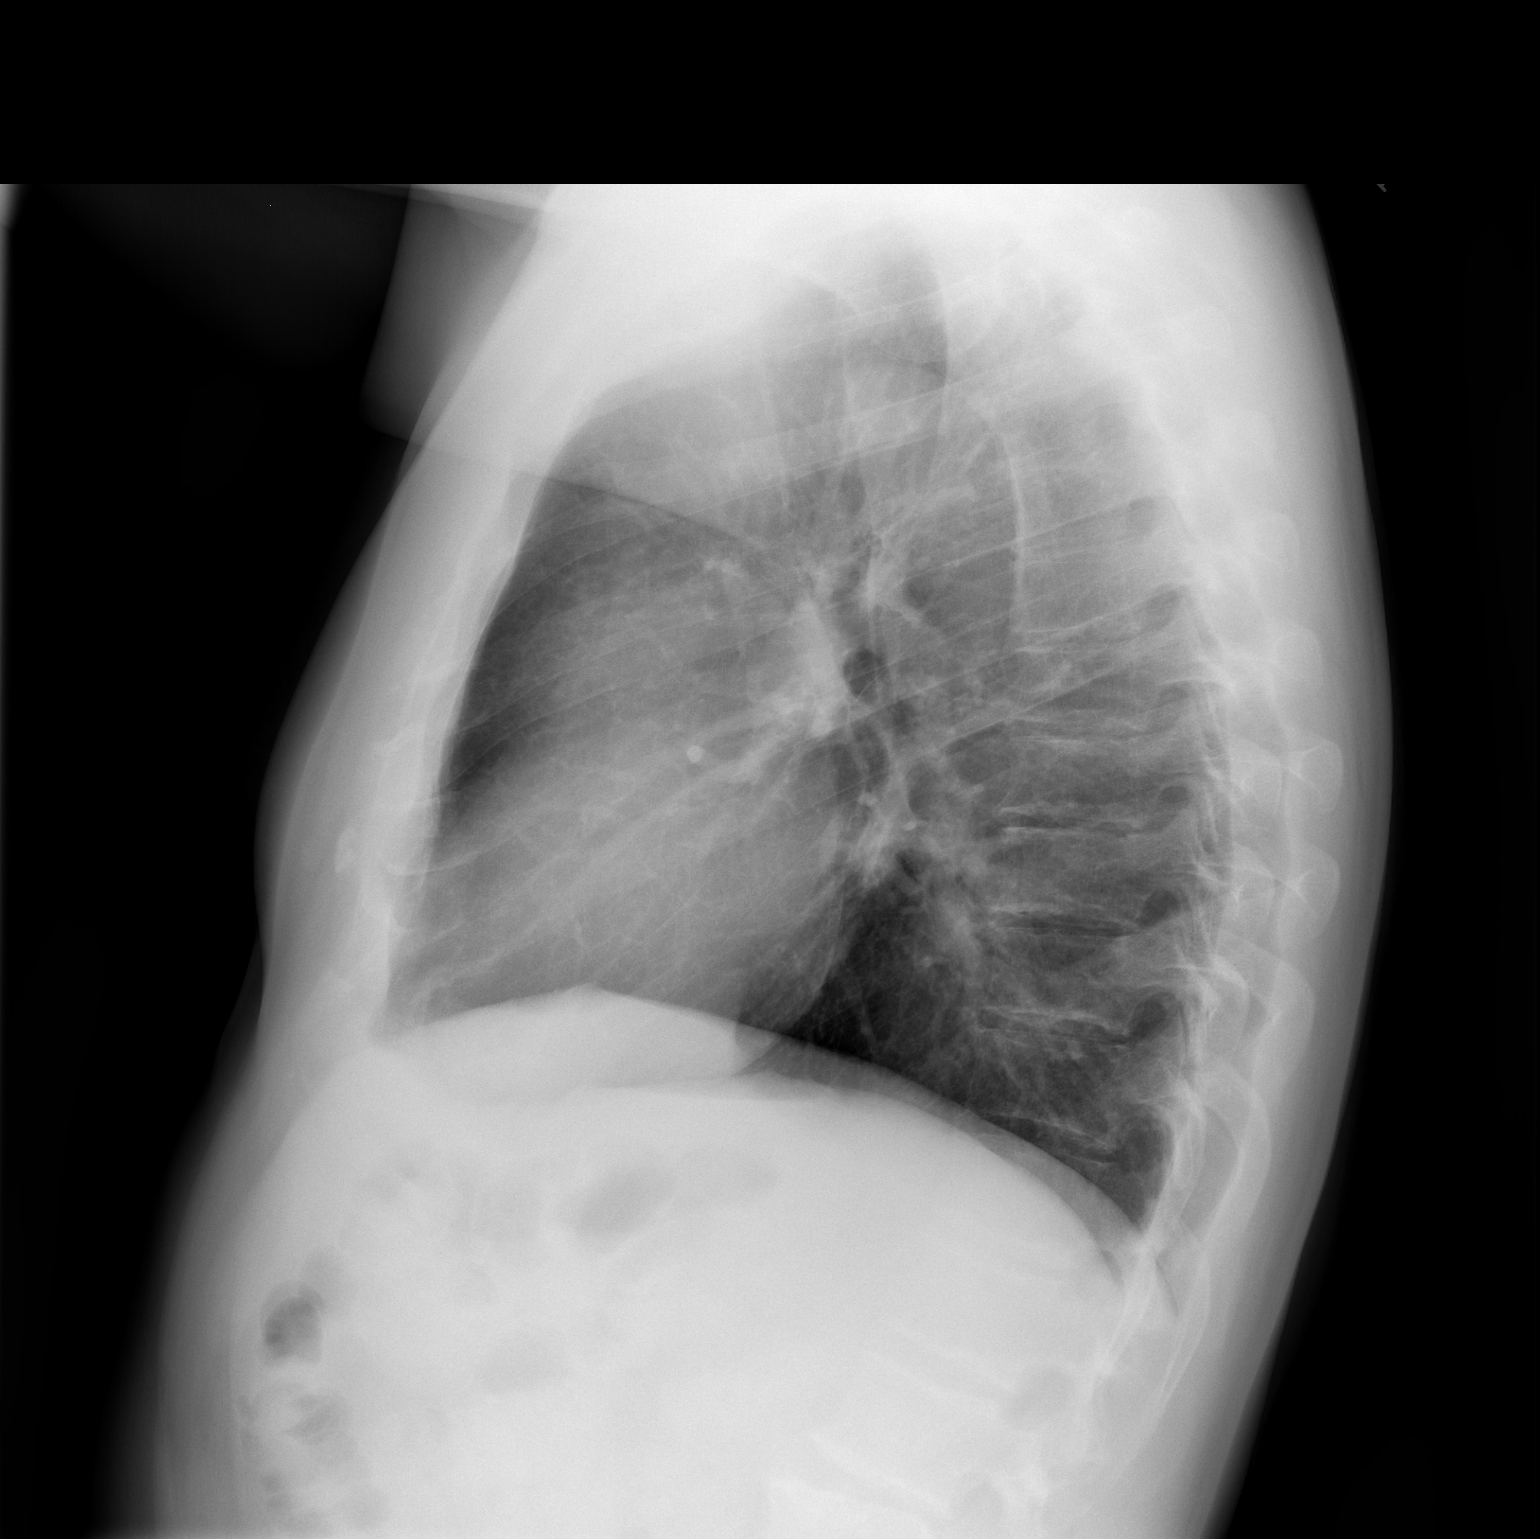

[2 of 2 positions shown; findings below may reference images not displayed]

FINDINGS: The heart size and mediastinal contours are within normal limits.
Both lungs are clear. No pleural effusion or pneumothorax. The
visualized skeletal structures are unremarkable.
IMPRESSION: No active cardiopulmonary disease.

## 2020-03-01 NOTE — H&P (View-Only) (Signed)
Cardiology Office Note:   Date:  03/03/2020  NAME:  Alexander Gross    MRN: [id number] DOB:  06/05/1960   PCP:  Renford Dills, MD  Cardiologist:  No primary care provider on file.   Referring MD: Renford Dills, MD   Chief Complaint  Patient presents with  . Shortness of Breath   History of Present Illness:   Alexander Gross is a 59 y.o. male with a hx of GERD, hypertension who is being seen today for the evaluation of shortness of breath at the request of Renford Dills, MD.  Reports the last 6 months has had episodes of chest tightness and shortness of breath with exertion.  He reports when he goes hunting or does anything such as climbing a hill he can get shortness of breath and chest tightness.  Symptoms resolved once he starts doing any activity.  Symptoms are not improving.  Symptoms appear to be worsening.  CVD risk factors include hypertension which is well controlled.  He also has hyperlipidemia.  His primary care physician started him on aspirin and gave him a prescription for nitroglycerin.  He was then sent for further evaluation.  He reports he has not done much strenuous activity since this has been going on.  He would like to know if there is any major cardiac issues going on.  His EKG today demonstrates sinus bradycardia heart rate 56.  He was started on metoprolol by his primary care physician.  He does have nonspecific ST-T changes noted.  He does use chewing tobacco and has done so for 45+ years.  He is never had a heart attack or stroke.  Family history is significant for heart disease in his mother.  He is married.  Still works in Theatre stage manager.  Total cholesterol 162, HDL 86, LDL 67, triglycerides 43  Past Medical History: Past Medical History:  Diagnosis Date  . GERD (gastroesophageal reflux disease)   . Headache    "daily since last Saturday" (10/27/2015)  . Hypertension   . Tick bite ~ 08/2015   bitten by multiple ticks about two months ago when hiking in  [place]/notes 10/26/2015    Past Surgical History: Past Surgical History:  Procedure Laterality Date  . TONSILLECTOMY      Current Medications: Current Meds  Medication Sig  . acetaminophen (TYLENOL) 500 MG tablet Take 1,000 mg by mouth every 4 (four) hours as needed for fever.   Marland Kitchen losartan (COZAAR) 50 MG tablet Take 50 mg by mouth daily.  . [DISCONTINUED] doxycycline (DORYX) 100 MG EC tablet Take 100 mg by mouth daily.  . [DISCONTINUED] ibuprofen (ADVIL,MOTRIN) 600 MG tablet Take 1 tablet (600 mg total) by mouth every 6 (six) hours as needed for headache.  . [DISCONTINUED] loratadine (CLARITIN) 10 MG tablet Take 10 mg by mouth daily.     Allergies:    Morphine   Social History: Social History   Socioeconomic History  . Marital status: Married    Spouse name: Not on file  . Number of children: 2  . Years of education: Not on file  . Highest education level: Not on file  Occupational History  . Occupation: quality control   Tobacco Use  . Smoking status: Never Smoker  . Smokeless tobacco: Current User    Types: Snuff  Vaping Use  . Vaping Use: Never used  Substance and Sexual Activity  . Alcohol use: Yes    Alcohol/week: 12.0 standard drinks    Types: 12 Cans of beer  per week  . Drug use: No  . Sexual activity: Not Currently    Partners: Female  Other Topics Concern  . Not on file  Social History Narrative   Right handed   Lives in a two story home with wife.   Social Determinants of Health   Financial Resource Strain: Not on file  Food Insecurity: Not on file  Transportation Needs: Not on file  Physical Activity: Not on file  Stress: Not on file  Social Connections: Not on file     Family History: The patient's family history includes Cancer in his father; Heart disease in his mother; Migraines in his mother.  ROS:   All other ROS reviewed and negative. Pertinent positives noted in the HPI.     EKGs/Labs/Other Studies Reviewed:   The following studies  were personally reviewed by me today:  EKG:  EKG is ordered today.  The ekg ordered today demonstrates sinus bradycardia, heart rate 56, nonspecific ST-T changes noted, and was personally reviewed by me.   Recent Labs: No results found for requested labs within last 8760 hours.   Recent Lipid Panel No results found for: CHOL, TRIG, HDL, CHOLHDL, VLDL, LDLCALC, LDLDIRECT  Physical Exam:   VS:  BP (!) 142/71   Pulse (!) 56   Ht 5\' 7"  (1.702 m)   Wt 174 lb 6.4 oz (79.1 kg)   SpO2 99%   BMI 27.31 kg/m    Wt Readings from Last 3 Encounters:  03/03/20 174 lb 6.4 oz (79.1 kg)  03/06/19 170 lb (77.1 kg)  10/29/15 162 lb (73.5 kg)    General: Well nourished, well developed, in no acute distress Heart: Atraumatic, normal size  Eyes: PEERLA, EOMI  Neck: Supple, no JVD Endocrine: No thryomegaly Cardiac: Normal S1, S2; RRR; no murmurs, rubs, or gallops Lungs: Clear to auscultation bilaterally, no wheezing, rhonchi or rales  Abd: Soft, nontender, no hepatomegaly  Ext: No edema, pulses 2+ Musculoskeletal: No deformities, BUE and BLE strength normal and equal Skin: Warm and dry, no rashes   Neuro: Alert and oriented to person, place, time, and situation, CNII-XII grossly intact, no focal deficits  Psych: Normal mood and affect   ASSESSMENT:   Alexander Gross is a 59 y.o. male who presents for the following: 1. SOB (shortness of breath) on exertion   2. Unstable angina (HCC)   3. Mixed hyperlipidemia     PLAN:   1. SOB (shortness of breath) on exertion 2. Unstable angina (HCC) -He presents with worsening exertional shortness of breath and chest tightness.  EKG with sinus bradycardia nonspecific ST-T changes. Symptoms occur with heavy exertion.  Symptoms are classic for stable angina that is progressing to unstable angina.  Given that he had recurrent symptoms are not getting better I think we should proceed with left heart catheterization to define his anatomy.  He may not need PCI but  I do not want to delay the identification of his CAD with stress testing.  I think we should proceed with this as soon as possible.  He should continue his aspirin 81 mg a day.  He'll continue his metoprolol succinate as prescribed by his primary care physician.  I will start him on Crestor 20 mg a day.  He also will continue with sublingual nitroglycerin as needed.  He was given strict return precautions if he takes more than three doses, 3 to 5 minutes per each dose, 15 minutes in total of recurrent chest pain.  He is not  had any prolonged episodes from what I can tell.  3. Mixed hyperlipidemia -Start Crestor 20 mg a day.  Disposition: Return in about 2 weeks (around 03/17/2020).  Medication Adjustments/Labs and Tests Ordered: Current medicines are reviewed at length with the patient today.  Concerns regarding medicines are outlined above.  Orders Placed This Encounter  Procedures  . Basic metabolic panel  . CBC  . EKG 12-Lead  . ECHOCARDIOGRAM COMPLETE   Meds ordered this encounter  Medications  . rosuvastatin (CRESTOR) 20 MG tablet    Sig: Take 1 tablet (20 mg total) by mouth daily.    Dispense:  90 tablet    Refill:  3    Patient Instructions  Medication Instructions:  STOP TAKING IBUPROFEN USE TYLENOL AS NEEDED START CRESTOR: 20mg  ONE TABLET ONCE DAILY  *If you need a refill on your cardiac medications before your next appointment, please call your pharmacy*  Lab Work: BMET, CBC TODAY  If you have labs (blood work) drawn today and your tests are completely normal, you will receive your results only by: MyChart Message (if you have MyChart) OR . A paper copy in the mail If you have any lab test that is abnormal or we need to change your treatment, we will call you to review the results.  You will need a COVID-19  test prior to your procedure. You are scheduled for 12/10 at 12:15 PM. This is a Drive Up Visit at [address] [place], [place]. Someone will  direct you to the appropriate testing line. Stay in your car and someone will be with you shortly.  Testing/Procedures: Your physician has requested that you have an echocardiogram. Echocardiography is a painless test that uses sound waves to create images of your heart. It provides your doctor with information about the size and shape of your heart and how well your heart's chambers and valves are working. You may receive an ultrasound enhancing agent through an IV if needed to better visualize your heart during the echo.This procedure takes approximately one hour. There are no restrictions for this procedure. This will take place at the [address].   LEFT HEART CATH- PLEASE SEE INFO SHEET   Follow-Up: At [facility], you and your health needs are our priority.  As part of our continuing mission to provide you with exceptional heart care, we have created designated Provider Care Teams.  These Care Teams include your primary Cardiologist (physician) and Advanced Practice Providers (APPs -  Physician Assistants and Nurse Practitioners) who all work together to provide you with the care you need, when you need it.  We recommend signing up for the patient portal called "MyChart".  Sign up information is provided on this After Visit Summary.  MyChart is used to connect with patients for Virtual Visits (Telemedicine).  Patients are able to view lab/test results, encounter notes, upcoming appointments, etc.  Non-urgent messages can be sent to your provider as well.   To learn more about what you can do with MyChart, go to [facility].    Your next appointment:   2 week(s)  The format for your next appointment:   In Person  Provider:   [email or web address], MD  Other Instructions    [facility] CARDIOVASCULAR DIVISION [facility] [address] [place] [address] [place] Dept: [phone] Loc: [phone]  Alexander Gross  03/03/2020  You are scheduled for a Cardiac Catheterization on Tuesday, December  14 with Dr. Henry Smith.  1. Please arrive at the North Tower (Main Entrance A) at [facility]: [address] [place], [place] [address] at 5:30 AM (This time is two hours before your procedure to ensure your preparation). Free valet parking service is available.   Special note: Every effort is made to have your procedure done on time. Please understand that emergencies sometimes delay scheduled procedures.  2. Diet: Do not eat solid foods after midnight.  The patient may have clear liquids until 5am upon the day of the procedure.  3. Labs: You will need to have blood drawn on Thursday, December 9 at [facility] [address], [place]  Open: 8am - 5pm (Lunch 12:30 - 1:30)   Phone: [phone]. You do not need to be fasting.  4. Medication instructions in preparation for your procedure:   Contrast Allergy: No  On the morning of your procedure, take your Aspirin and any morning medicines NOT listed above.  You may use sips of water.  5. Plan for one night stay--bring personal belongings. 6. Bring a current list of your medications and current insurance cards. 7. You MUST have a responsible person to drive you home. 8. Someone MUST be with you the first 24 hours after you arrive home or your discharge will be delayed. 9. Please wear clothes that are easy to get on and off and wear slip-on shoes.  Thank you for allowing us to care for you!   -- [place] [facility]     Signed, Brownlee T. O'Neal, MD [place]  [facility]  [address] [place], [place] [address] [phone]  03/03/2020 2:16 PM     

## 2020-03-01 NOTE — Progress Notes (Signed)
Cardiology Office Note:   Date:  03/03/2020  NAME:  Alexander Gross    MRN: [id number] DOB:  06/05/1960   PCP:  Renford Dills, MD  Cardiologist:  No primary care provider on file.   Referring MD: Renford Dills, MD   Chief Complaint  Patient presents with  . Shortness of Breath   History of Present Illness:   Alexander Gross is a 59 y.o. male with a hx of GERD, hypertension who is being seen today for the evaluation of shortness of breath at the request of Renford Dills, MD.  Reports the last 6 months has had episodes of chest tightness and shortness of breath with exertion.  He reports when he goes hunting or does anything such as climbing a hill he can get shortness of breath and chest tightness.  Symptoms resolved once he starts doing any activity.  Symptoms are not improving.  Symptoms appear to be worsening.  CVD risk factors include hypertension which is well controlled.  He also has hyperlipidemia.  His primary care physician started him on aspirin and gave him a prescription for nitroglycerin.  He was then sent for further evaluation.  He reports he has not done much strenuous activity since this has been going on.  He would like to know if there is any major cardiac issues going on.  His EKG today demonstrates sinus bradycardia heart rate 56.  He was started on metoprolol by his primary care physician.  He does have nonspecific ST-T changes noted.  He does use chewing tobacco and has done so for 45+ years.  He is never had a heart attack or stroke.  Family history is significant for heart disease in his mother.  He is married.  Still works in Theatre stage manager.  Total cholesterol 162, HDL 86, LDL 67, triglycerides 43  Past Medical History: Past Medical History:  Diagnosis Date  . GERD (gastroesophageal reflux disease)   . Headache    "daily since last Saturday" (10/27/2015)  . Hypertension   . Tick bite ~ 08/2015   bitten by multiple ticks about two months ago when hiking in  [place]/notes 10/26/2015    Past Surgical History: Past Surgical History:  Procedure Laterality Date  . TONSILLECTOMY      Current Medications: Current Meds  Medication Sig  . acetaminophen (TYLENOL) 500 MG tablet Take 1,000 mg by mouth every 4 (four) hours as needed for fever.   Marland Kitchen losartan (COZAAR) 50 MG tablet Take 50 mg by mouth daily.  . [DISCONTINUED] doxycycline (DORYX) 100 MG EC tablet Take 100 mg by mouth daily.  . [DISCONTINUED] ibuprofen (ADVIL,MOTRIN) 600 MG tablet Take 1 tablet (600 mg total) by mouth every 6 (six) hours as needed for headache.  . [DISCONTINUED] loratadine (CLARITIN) 10 MG tablet Take 10 mg by mouth daily.     Allergies:    Morphine   Social History: Social History   Socioeconomic History  . Marital status: Married    Spouse name: Not on file  . Number of children: 2  . Years of education: Not on file  . Highest education level: Not on file  Occupational History  . Occupation: quality control   Tobacco Use  . Smoking status: Never Smoker  . Smokeless tobacco: Current User    Types: Snuff  Vaping Use  . Vaping Use: Never used  Substance and Sexual Activity  . Alcohol use: Yes    Alcohol/week: 12.0 standard drinks    Types: 12 Cans of beer  per week  . Drug use: No  . Sexual activity: Not Currently    Partners: Female  Other Topics Concern  . Not on file  Social History Narrative   Right handed   Lives in a two story home with wife.   Social Determinants of Health   Financial Resource Strain: Not on file  Food Insecurity: Not on file  Transportation Needs: Not on file  Physical Activity: Not on file  Stress: Not on file  Social Connections: Not on file     Family History: The patient's family history includes Cancer in his father; Heart disease in his mother; Migraines in his mother.  ROS:   All other ROS reviewed and negative. Pertinent positives noted in the HPI.     EKGs/Labs/Other Studies Reviewed:   The following studies  were personally reviewed by me today:  EKG:  EKG is ordered today.  The ekg ordered today demonstrates sinus bradycardia, heart rate 56, nonspecific ST-T changes noted, and was personally reviewed by me.   Recent Labs: No results found for requested labs within last 8760 hours.   Recent Lipid Panel No results found for: CHOL, TRIG, HDL, CHOLHDL, VLDL, LDLCALC, LDLDIRECT  Physical Exam:   VS:  BP (!) 142/71   Pulse (!) 56   Ht 5\' 7"  (1.702 m)   Wt 174 lb 6.4 oz (79.1 kg)   SpO2 99%   BMI 27.31 kg/m    Wt Readings from Last 3 Encounters:  03/03/20 174 lb 6.4 oz (79.1 kg)  03/06/19 170 lb (77.1 kg)  10/29/15 162 lb (73.5 kg)    General: Well nourished, well developed, in no acute distress Heart: Atraumatic, normal size  Eyes: PEERLA, EOMI  Neck: Supple, no JVD Endocrine: No thryomegaly Cardiac: Normal S1, S2; RRR; no murmurs, rubs, or gallops Lungs: Clear to auscultation bilaterally, no wheezing, rhonchi or rales  Abd: Soft, nontender, no hepatomegaly  Ext: No edema, pulses 2+ Musculoskeletal: No deformities, BUE and BLE strength normal and equal Skin: Warm and dry, no rashes   Neuro: Alert and oriented to person, place, time, and situation, CNII-XII grossly intact, no focal deficits  Psych: Normal mood and affect   ASSESSMENT:   Alexander Gross is a 59 y.o. male who presents for the following: 1. SOB (shortness of breath) on exertion   2. Unstable angina (HCC)   3. Mixed hyperlipidemia     PLAN:   1. SOB (shortness of breath) on exertion 2. Unstable angina (HCC) -He presents with worsening exertional shortness of breath and chest tightness.  EKG with sinus bradycardia nonspecific ST-T changes. Symptoms occur with heavy exertion.  Symptoms are classic for stable angina that is progressing to unstable angina.  Given that he had recurrent symptoms are not getting better I think we should proceed with left heart catheterization to define his anatomy.  He may not need PCI but  I do not want to delay the identification of his CAD with stress testing.  I think we should proceed with this as soon as possible.  He should continue his aspirin 81 mg a day.  He'll continue his metoprolol succinate as prescribed by his primary care physician.  I will start him on Crestor 20 mg a day.  He also will continue with sublingual nitroglycerin as needed.  He was given strict return precautions if he takes more than three doses, 3 to 5 minutes per each dose, 15 minutes in total of recurrent chest pain.  He is not  had any prolonged episodes from what I can tell.  3. Mixed hyperlipidemia -Start Crestor 20 mg a day.  Disposition: Return in about 2 weeks (around 03/17/2020).  Medication Adjustments/Labs and Tests Ordered: Current medicines are reviewed at length with the patient today.  Concerns regarding medicines are outlined above.  Orders Placed This Encounter  Procedures  . Basic metabolic panel  . CBC  . EKG 12-Lead  . ECHOCARDIOGRAM COMPLETE   Meds ordered this encounter  Medications  . rosuvastatin (CRESTOR) 20 MG tablet    Sig: Take 1 tablet (20 mg total) by mouth daily.    Dispense:  90 tablet    Refill:  3    Patient Instructions  Medication Instructions:  STOP TAKING IBUPROFEN USE TYLENOL AS NEEDED START CRESTOR: 20mg  ONE TABLET ONCE DAILY  *If you need a refill on your cardiac medications before your next appointment, please call your pharmacy*  Lab Work: BMET, CBC TODAY  If you have labs (blood work) drawn today and your tests are completely normal, you will receive your results only by: MyChart Message (if you have MyChart) OR . A paper copy in the mail If you have any lab test that is abnormal or we need to change your treatment, we will call you to review the results.  You will need a COVID-19  test prior to your procedure. You are scheduled for 12/10 at 12:15 PM. This is a Drive Up Visit at [address] [place], [place]. Someone will  direct you to the appropriate testing line. Stay in your car and someone will be with you shortly.  Testing/Procedures: Your physician has requested that you have an echocardiogram. Echocardiography is a painless test that uses sound waves to create images of your heart. It provides your doctor with information about the size and shape of your heart and how well your heart's chambers and valves are working. You may receive an ultrasound enhancing agent through an IV if needed to better visualize your heart during the echo.This procedure takes approximately one hour. There are no restrictions for this procedure. This will take place at the [address].   LEFT HEART CATH- PLEASE SEE INFO SHEET   Follow-Up: At [facility], you and your health needs are our priority.  As part of our continuing mission to provide you with exceptional heart care, we have created designated Provider Care Teams.  These Care Teams include your primary Cardiologist (physician) and Advanced Practice Providers (APPs -  Physician Assistants and Nurse Practitioners) who all work together to provide you with the care you need, when you need it.  We recommend signing up for the patient portal called "MyChart".  Sign up information is provided on this After Visit Summary.  MyChart is used to connect with patients for Virtual Visits (Telemedicine).  Patients are able to view lab/test results, encounter notes, upcoming appointments, etc.  Non-urgent messages can be sent to your provider as well.   To learn more about what you can do with MyChart, go to [facility].    Your next appointment:   2 week(s)  The format for your next appointment:   In Person  Provider:   [email or web address], MD  Other Instructions    [facility] CARDIOVASCULAR DIVISION [facility] [address] [place] [address] [place] Dept: [phone] Loc: [phone]  TORRANCE STOCKLEY  03/03/2020  You are scheduled for a Cardiac Catheterization on Tuesday, December  14 with Dr. Verdis PrimeHenry Smith.  1. [facility]: [address] [place], [place] [address] at 5:30 AM (This time is two hours before your procedure to ensure your preparation). Free valet parking service is available.   Special note: Every effort is made to have your procedure done on time. Please understand that emergencies sometimes delay scheduled procedures.  2. Diet: Do not eat solid foods after midnight.  The patient may have clear liquids until 5am upon the day of the procedure.  3. Labs: You will need to have blood drawn on Thursday, December 9 at [facility] [address], [place]  Open: 8am - 5pm (Lunch 12:30 - 1:30)   Phone: [phone]. You do not need to be fasting.  4. Medication instructions in preparation for your procedure:   Contrast Allergy: No  On the morning of your procedure, take your Aspirin and any morning medicines NOT listed above.  You may use sips of water.  5. Plan for one night stay--bring personal belongings. 6. Bring a current list of your medications and current insurance cards. 7. You MUST have a responsible person to drive you home. 8. Someone MUST be with you the first 24 hours after you arrive home or your discharge will be delayed. 9. Please wear clothes that are easy to get on and off and wear slip-on shoes.  Thank you for allowing us to care for you!   -- [place] [facility]     Signed, Lenna GilfordWesley T. Flora Lipps'Neal, MD [facility]  [facility]  [address] [place], [place] [address] [phone]  03/03/2020 2:16 PM

## 2020-03-03 ENCOUNTER — Encounter: Payer: Self-pay | Admitting: Cardiovascular Disease

## 2020-03-03 ENCOUNTER — Ambulatory Visit (INDEPENDENT_AMBULATORY_CARE_PROVIDER_SITE_OTHER): Payer: BC Managed Care – PPO | Admitting: Cardiovascular Disease

## 2020-03-03 ENCOUNTER — Other Ambulatory Visit: Payer: Self-pay

## 2020-03-03 VITALS — BP 142/71 | HR 56 | Ht 67.0 in | Wt 174.4 lb

## 2020-03-03 DIAGNOSIS — I200 Unstable angina: Secondary | ICD-10-CM

## 2020-03-03 DIAGNOSIS — R0602 Shortness of breath: Secondary | ICD-10-CM | POA: Diagnosis not present

## 2020-03-03 DIAGNOSIS — E782 Mixed hyperlipidemia: Secondary | ICD-10-CM

## 2020-03-03 MED ORDER — ROSUVASTATIN CALCIUM 20 MG PO TABS: 20.0000 mg | ORAL_TABLET | Freq: Every day | ORAL | 3 refills | Status: DC

## 2020-03-03 NOTE — Patient Instructions (Signed)
Medication Instructions:  STOP TAKING IBUPROFEN USE TYLENOL AS NEEDED START CRESTOR: 20mg  ONE TABLET ONCE DAILY  *If you need a refill on your cardiac medications before your next appointment, please call your pharmacy*  Lab Work: BMET, CBC TODAY  If you have labs (blood work) drawn today and your tests are completely normal, you will receive your results only by: MyChart Message (if you have MyChart) OR . A paper copy in the mail If you have any lab test that is abnormal or we need to change your treatment, we will call you to review the results.  You will need a COVID-19  test prior to your procedure. You are scheduled for 12/10 at 12:15 PM. This is a Drive Up Visit at [address] [place], [place]. Someone will direct you to the appropriate testing line. Stay in your car and someone will be with you shortly.  Testing/Procedures: Your physician has requested that you have an echocardiogram. Echocardiography is a painless test that uses sound waves to create images of your heart. It provides your doctor with information about the size and shape of your heart and how well your heart's chambers and valves are working. You may receive an ultrasound enhancing agent through an IV if needed to better visualize your heart during the echo.This procedure takes approximately one hour. There are no restrictions for this procedure. This will take place at the [address]   LEFT HEART CATH- PLEASE SEE INFO SHEET   Follow-Up: At [facility], you and your health needs are our priority.  As part of our continuing mission to provide you with exceptional heart care, we have created designated Provider Care Teams.  These Care Teams include your primary Cardiologist (physician) and Advanced Practice Providers (APPs -  Physician Assistants and Nurse Practitioners) who all work together to provide you with the care you need, when you need it.  We recommend signing up for the  patient portal called "MyChart".  Sign up information is provided on this After Visit Summary.  MyChart is used to connect with patients for Virtual Visits (Telemedicine).  Patients are able to view lab/test results, encounter notes, upcoming appointments, etc.  Non-urgent messages can be sent to your provider as well.   To learn more about what you can do with MyChart, go to [facility].    Your next appointment:   2 week(s)  The format for your next appointment:   In Person  Provider:   [email or web address], MD  Other Instructions    [facility] CARDIOVASCULAR DIVISION [facility] [address] [place] [address] [place] Dept: [phone] Loc: [phone]  Alexander Gross  03/03/2020  You are scheduled for a Cardiac Catheterization on Tuesday, December 14 with Dr. December 16.  1. Please arrive at the [facility] (Main Entrance A) at [facility]: [address] [place], [place] at 5:30 AM (This time is two hours before your procedure to ensure your preparation). Free valet parking service is available.   Special note: Every effort is made to have your procedure done on time. Please understand that emergencies sometimes delay scheduled procedures.  2. Diet: Do not eat solid foods after midnight.  The patient may have clear liquids until 5am upon the day of the procedure.  3. Labs: You will need to have blood drawn on Thursday, December 9 at [facility] [address] [facility] [address], [employer]  Open: 8am -  5pm (Lunch 12:30 - 1:30)   Phone: [phone]. You do not need to be fasting.  4. Medication instructions in preparation for your procedure:   Contrast Allergy: No  On the morning of your procedure, take your Aspirin and any morning medicines NOT listed above.  You may use sips of water.  5. Plan for one night stay--bring personal belongings. 6. Bring a current list of your medications and current insurance  cards. 7. You MUST have a responsible person to drive you home. 8. Someone MUST be with you the first 24 hours after you arrive home or your discharge will be delayed. 9. Please wear clothes that are easy to get on and off and wear slip-on shoes.  Thank you for allowing [place] to care for you!   -- [facility]

## 2020-03-04 ENCOUNTER — Other Ambulatory Visit (HOSPITAL_COMMUNITY)
Admission: RE | Admit: 2020-03-04 | Discharge: 2020-03-04 | Disposition: A | Discharge status: Home / Self Care | Payer: BC Managed Care – PPO | Source: Ambulatory Visit | Attending: Interventional Cardiology | Admitting: Interventional Cardiology

## 2020-03-04 ENCOUNTER — Encounter: Payer: Self-pay | Admitting: *Deleted

## 2020-03-04 ENCOUNTER — Other Ambulatory Visit: Payer: Self-pay

## 2020-03-04 DIAGNOSIS — I200 Unstable angina: Secondary | ICD-10-CM

## 2020-03-04 DIAGNOSIS — Z20822 Contact with and (suspected) exposure to covid-19: Secondary | ICD-10-CM | POA: Diagnosis not present

## 2020-03-04 DIAGNOSIS — Z01812 Encounter for preprocedural laboratory examination: Secondary | ICD-10-CM | POA: Insufficient documentation

## 2020-03-04 LAB — BASIC METABOLIC PANEL
BUN/Creatinine Ratio: 14 (ref 9–20)
BUN: 12 mg/dL (ref 6–24)
CO2: 25 mmol/L (ref 20–29)
Calcium: 9.7 mg/dL (ref 8.7–10.2)
Chloride: 99 mmol/L (ref 96–106)
Creatinine, Ser: 0.85 mg/dL (ref 0.76–1.27)
GFR calc Af Amer: 112 mL/min/{1.73_m2} (ref >59)
GFR calc non Af Amer: 97 mL/min/{1.73_m2} (ref >59)
Glucose: 82 mg/dL (ref 65–99)
Potassium: 5 mmol/L (ref 3.5–5.2)
Sodium: 137 mmol/L (ref 134–144)

## 2020-03-04 LAB — CBC
Hematocrit: 46.5 % (ref 37.5–51.0)
Hemoglobin: 16.4 g/dL (ref 13.0–17.7)
MCH: 33 pg (ref 26.6–33.0)
MCHC: 35.3 g/dL (ref 31.5–35.7)
MCV: 94 fL (ref 79–97)
Platelets: 211 10*3/uL (ref 150–450)
RBC: 4.97 x10E6/uL (ref 4.14–5.80)
RDW: 12 % (ref 11.6–15.4)
WBC: 4.9 10*3/uL (ref 3.4–10.8)

## 2020-03-04 LAB — SARS CORONAVIRUS 2 (TAT 6-24 HRS): SARS Coronavirus 2: NEGATIVE

## 2020-03-04 MED ORDER — SODIUM CHLORIDE 0.9% FLUSH: 3.0000 mL | Freq: Two times a day (BID) | INTRAVENOUS | Status: AC

## 2020-03-06 NOTE — H&P (Signed)
COPD + family h/o CAD Chest tightness and DOE. Unknown LVEF

## 2020-03-07 ENCOUNTER — Telehealth: Payer: Self-pay | Admitting: *Deleted

## 2020-03-07 NOTE — Telephone Encounter (Addendum)
Pt contacted pre-catheterization scheduled at [facility] for: Tuesday March 08, 2020 7:30 AM Verified arrival time and place: [facility] Main Entrance A [facility]) at: 5:30 AM   No solid food after midnight prior to cath, clear liquids until 5 AM day of procedure.  AM meds can be  taken pre-cath with sips of water including: ASA 81 mg   Confirmed patient has responsible adult to drive home post procedure and be with patient first 24 hours after arriving home: yes  You are allowed ONE visitor in the waiting room during the time you are at the hospital for your procedure. Both you and your visitor must wear a mask once you enter the hospital.       COVID-19 Pre-Screening Questions:  . In the past 14 days have you had any symptoms concerning for COVID-19 infection (fever, chills, cough, or new shortness of breath)? no . In the past 14 days have you been around anyone with known Covid 19? no             Reviewed procedure/mask/visitor instructions, COVID-19 questions reviewed with patient.

## 2020-03-08 ENCOUNTER — Encounter (HOSPITAL_COMMUNITY): Payer: Self-pay | Admitting: Interventional Cardiology

## 2020-03-08 ENCOUNTER — Other Ambulatory Visit: Payer: Self-pay

## 2020-03-08 ENCOUNTER — Ambulatory Visit (HOSPITAL_COMMUNITY)
Admission: RE | Admit: 2020-03-08 | Discharge: 2020-03-08 | Disposition: A | Discharge status: Home / Self Care | Payer: BC Managed Care – PPO | Attending: Interventional Cardiology | Admitting: Interventional Cardiology

## 2020-03-08 ENCOUNTER — Encounter (HOSPITAL_COMMUNITY)
Admission: RE | Disposition: A | Discharge status: Home / Self Care | Payer: Self-pay | Source: Home / Self Care | Attending: Interventional Cardiology

## 2020-03-08 DIAGNOSIS — I25110 Atherosclerotic heart disease of native coronary artery with unstable angina pectoris: Secondary | ICD-10-CM | POA: Diagnosis not present

## 2020-03-08 DIAGNOSIS — Z79899 Other long term (current) drug therapy: Secondary | ICD-10-CM | POA: Insufficient documentation

## 2020-03-08 DIAGNOSIS — R0609 Other forms of dyspnea: Secondary | ICD-10-CM

## 2020-03-08 DIAGNOSIS — Z7982 Long term (current) use of aspirin: Secondary | ICD-10-CM | POA: Diagnosis not present

## 2020-03-08 DIAGNOSIS — R0600 Dyspnea, unspecified: Secondary | ICD-10-CM | POA: Diagnosis not present

## 2020-03-08 DIAGNOSIS — E782 Mixed hyperlipidemia: Secondary | ICD-10-CM | POA: Diagnosis not present

## 2020-03-08 DIAGNOSIS — R0789 Other chest pain: Secondary | ICD-10-CM | POA: Diagnosis not present

## 2020-03-08 DIAGNOSIS — Z885 Allergy status to narcotic agent status: Secondary | ICD-10-CM | POA: Diagnosis not present

## 2020-03-08 DIAGNOSIS — R0602 Shortness of breath: Secondary | ICD-10-CM | POA: Insufficient documentation

## 2020-03-08 DIAGNOSIS — I200 Unstable angina: Secondary | ICD-10-CM

## 2020-03-08 HISTORY — PX: LEFT HEART CATH AND CORONARY ANGIOGRAPHY: CATH118249

## 2020-03-08 SURGERY — LEFT HEART CATH AND CORONARY ANGIOGRAPHY
Anesthesia: LOCAL

## 2020-03-08 MED ORDER — SODIUM CHLORIDE 0.9 % IV SOLN: INTRAVENOUS | Status: DC

## 2020-03-08 MED ORDER — LIDOCAINE HCL (PF) 1 % IJ SOLN
INTRAMUSCULAR | Status: AC
  Filled 2020-03-08: qty 30

## 2020-03-08 MED ORDER — VERAPAMIL HCL 2.5 MG/ML IV SOLN
INTRAVENOUS | Status: DC | PRN
  Administered 2020-03-08: 10 mL via INTRA_ARTERIAL

## 2020-03-08 MED ORDER — SODIUM CHLORIDE 0.9 % IV SOLN: 250.0000 mL | INTRAVENOUS | Status: DC | PRN

## 2020-03-08 MED ORDER — SODIUM CHLORIDE 0.9% FLUSH: 3.0000 mL | INTRAVENOUS | Status: DC | PRN

## 2020-03-08 MED ORDER — SODIUM CHLORIDE 0.9 % WEIGHT BASED INFUSION
3.0000 mL/kg/h | INTRAVENOUS | Status: AC
  Administered 2020-03-08: 3 mL/kg/h via INTRAVENOUS

## 2020-03-08 MED ORDER — HYDRALAZINE HCL 20 MG/ML IJ SOLN: 10.0000 mg | INTRAMUSCULAR | Status: DC | PRN

## 2020-03-08 MED ORDER — ACETAMINOPHEN 325 MG PO TABS: 650.0000 mg | ORAL_TABLET | ORAL | Status: DC | PRN

## 2020-03-08 MED ORDER — FENTANYL CITRATE (PF) 100 MCG/2ML IJ SOLN
INTRAMUSCULAR | Status: DC | PRN
  Administered 2020-03-08: 50 ug via INTRAVENOUS

## 2020-03-08 MED ORDER — ASPIRIN 81 MG PO CHEW: 81.0000 mg | CHEWABLE_TABLET | ORAL | Status: DC

## 2020-03-08 MED ORDER — IOHEXOL 350 MG/ML SOLN
INTRAVENOUS | Status: DC | PRN
  Administered 2020-03-08: 70 mL

## 2020-03-08 MED ORDER — ONDANSETRON HCL 4 MG/2ML IJ SOLN: 4.0000 mg | Freq: Four times a day (QID) | INTRAMUSCULAR | Status: DC | PRN

## 2020-03-08 MED ORDER — FENTANYL CITRATE (PF) 100 MCG/2ML IJ SOLN
INTRAMUSCULAR | Status: AC
  Filled 2020-03-08: qty 2

## 2020-03-08 MED ORDER — HEPARIN SODIUM (PORCINE) 1000 UNIT/ML IJ SOLN
INTRAMUSCULAR | Status: AC
  Filled 2020-03-08: qty 1

## 2020-03-08 MED ORDER — MIDAZOLAM HCL 2 MG/2ML IJ SOLN
INTRAMUSCULAR | Status: AC
  Filled 2020-03-08: qty 2

## 2020-03-08 MED ORDER — VERAPAMIL HCL 2.5 MG/ML IV SOLN
INTRAVENOUS | Status: AC
  Filled 2020-03-08: qty 2

## 2020-03-08 MED ORDER — HEPARIN SODIUM (PORCINE) 1000 UNIT/ML IJ SOLN
INTRAMUSCULAR | Status: DC | PRN
  Administered 2020-03-08: 4000 [IU] via INTRAVENOUS

## 2020-03-08 MED ORDER — LABETALOL HCL 5 MG/ML IV SOLN
10.0000 mg | INTRAVENOUS | Status: DC | PRN
Start: 2020-03-08 — End: 2020-03-08

## 2020-03-08 MED ORDER — SODIUM CHLORIDE 0.9% FLUSH: 3.0000 mL | Freq: Two times a day (BID) | INTRAVENOUS | Status: DC

## 2020-03-08 MED ORDER — HEPARIN (PORCINE) IN NACL 1000-0.9 UT/500ML-% IV SOLN
INTRAVENOUS | Status: DC | PRN
  Administered 2020-03-08 (×2): 500 mL

## 2020-03-08 MED ORDER — SODIUM CHLORIDE 0.9 % WEIGHT BASED INFUSION: 1.0000 mL/kg/h | INTRAVENOUS | Status: DC

## 2020-03-08 MED ORDER — LIDOCAINE HCL (PF) 1 % IJ SOLN
INTRAMUSCULAR | Status: DC | PRN
  Administered 2020-03-08: 3 mL

## 2020-03-08 MED ORDER — MIDAZOLAM HCL 2 MG/2ML IJ SOLN
INTRAMUSCULAR | Status: DC | PRN
  Administered 2020-03-08: 1 mg via INTRAVENOUS

## 2020-03-08 MED ORDER — HEPARIN (PORCINE) IN NACL 1000-0.9 UT/500ML-% IV SOLN
INTRAVENOUS | Status: AC
  Filled 2020-03-08: qty 1000

## 2020-03-08 SURGICAL SUPPLY — 10 items
CATH 5FR JL3.5 JR4 ANG PIG MP (CATHETERS) ×2 IMPLANT
DEVICE RAD COMP TR BAND LRG (VASCULAR PRODUCTS) ×2 IMPLANT
GLIDESHEATH SLEND A-KIT 6F 22G (SHEATH) ×2 IMPLANT
GUIDEWIRE INQWIRE 1.5J.035X260 (WIRE) ×1 IMPLANT
INQWIRE 1.5J .035X260CM (WIRE) ×2
KIT HEART LEFT (KITS) ×2 IMPLANT
PACK CARDIAC CATHETERIZATION (CUSTOM PROCEDURE TRAY) ×2 IMPLANT
SHEATH PROBE COVER 6X72 (BAG) ×1 IMPLANT
TRANSDUCER W/STOPCOCK (MISCELLANEOUS) ×2 IMPLANT
TUBING CIL FLEX 10 FLL-RA (TUBING) ×2 IMPLANT

## 2020-03-08 NOTE — CV Procedure (Signed)
   Normal coronary arteries.  Low normal LV systolic function, EF 50%, EDP 14 mmHg.  No complications.

## 2020-03-08 NOTE — Interval H&P Note (Signed)
Cath Lab Visit (complete for each Cath Lab visit)  Clinical Evaluation Leading to the Procedure:   ACS: No.  Non-ACS:    Anginal Classification: CCS II  Anti-ischemic medical therapy: Minimal Therapy (1 class of medications)  Non-Invasive Test Results: No non-invasive testing performed  Prior CABG: No previous CABG      History and Physical Interval Note:  03/08/2020 7:27 AM  Dessa Phi  has presented today for surgery, with the diagnosis of unstable angina.  The various methods of treatment have been discussed with the patient and family. After consideration of risks, benefits and other options for treatment, the patient has consented to  Procedure(s): LEFT HEART CATH AND CORONARY ANGIOGRAPHY (N/A) as a surgical intervention.  The patient's history has been reviewed, patient examined, no change in status, stable for surgery.  I have reviewed the patient's chart and labs.  Questions were answered to the patient's satisfaction.     Lyn Records III

## 2020-03-08 NOTE — Discharge Instructions (Signed)
Radial Site Care  This sheet gives you information about how to care for yourself after your procedure. Your health care provider may also give you more specific instructions. If you have problems or questions, contact your health care provider. What can I expect after the procedure? After the procedure, it is common to have:  Bruising and tenderness at the catheter insertion area. Follow these instructions at home: Medicines  Take over-the-counter and prescription medicines only as told by your health care provider. Insertion site care  Follow instructions from your health care provider about how to take care of your insertion site. Make sure you: ? Wash your hands with soap and water before you change your bandage (dressing). If soap and water are not available, use hand sanitizer. ? Change your dressing as told by your health care provider. ? Leave stitches (sutures), skin glue, or adhesive strips in place. These skin closures may need to stay in place for 2 weeks or longer. If adhesive strip edges start to loosen and curl up, you may trim the loose edges. Do not remove adhesive strips completely unless your health care provider tells you to do that.  Check your insertion site every day for signs of infection. Check for: ? Redness, swelling, or pain. ? Fluid or blood. ? Pus or a bad smell. ? Warmth.  Do not take baths, swim, or use a hot tub until your health care provider approves.  You may shower 24-48 hours after the procedure, or as directed by your health care provider. ? Remove the dressing and gently wash the site with plain soap and water. ? Pat the area dry with a clean towel. ? Do not rub the site. That could cause bleeding.  Do not apply powder or lotion to the site. Activity   For 24 hours after the procedure, or as directed by your health care provider: ? Do not flex or bend the affected arm. ? Do not push or pull heavy objects with the affected arm. ? Do not  drive yourself home from the hospital or clinic. You may drive 24 hours after the procedure unless your health care provider tells you not to. ? Do not operate machinery or power tools.  Do not lift anything that is heavier than 10 lb (4.5 kg), or the limit that you are told, until your health care provider says that it is safe.  Ask your health care provider when it is okay to: ? Return to work or school. ? Resume usual physical activities or sports. ? Resume sexual activity. General instructions  If the catheter site starts to bleed, raise your arm and put firm pressure on the site. If the bleeding does not stop, get help right away. This is a medical emergency.  If you went home on the same day as your procedure, a responsible adult should be with you for the first 24 hours after you arrive home.  Keep all follow-up visits as told by your health care provider. This is important. Contact a health care provider if:  You have a fever.  You have redness, swelling, or yellow drainage around your insertion site. Get help right away if:  You have unusual pain at the radial site.  The catheter insertion area swells very fast.  The insertion area is bleeding, and the bleeding does not stop when you hold steady pressure on the area.  Your arm or hand becomes pale, cool, tingly, or numb. These symptoms may represent a serious problem   that is an emergency. Do not wait to see if the symptoms will go away. Get medical help right away. Call your local emergency services (911 in the [place] Do not drive yourself to the hospital. Summary  After the procedure, it is common to have bruising and tenderness at the site.  Follow instructions from your health care provider about how to take care of your radial site wound. Check the wound every day for signs of infection.  Do not lift anything that is heavier than 10 lb (4.5 kg), or the limit that you are told, until your health care provider says  that it is safe. This information is not intended to replace advice given to you by your health care provider. Make sure you discuss any questions you have with your health care provider. Document Revised: 04/17/2017 Document Reviewed: 04/17/2017 Elsevier Patient Education  2020 Elsevier Inc.  

## 2020-03-08 NOTE — Progress Notes (Signed)
Gabby CNA was in the room helping patient get dressed to go home and noticed his right radial site looked "puffy." Darreld Mclean RN went to assess the patient and found a small hematoma. She started holding pressure on patient's right wrist at approximately 11:10-11:15am. This RN relieved Trina and held pressure until 11:30am. Hematoma now level 0. Lillia Abed NP notified and states she will come to assess patient. Patient tolerated pressure well and is stable at this time with warm elevated.

## 2020-03-15 ENCOUNTER — Ambulatory Visit (HOSPITAL_COMMUNITY): Payer: BC Managed Care – PPO | Attending: Cardiovascular Disease

## 2020-03-15 ENCOUNTER — Other Ambulatory Visit: Payer: Self-pay

## 2020-03-15 DIAGNOSIS — I200 Unstable angina: Secondary | ICD-10-CM | POA: Diagnosis not present

## 2020-03-15 LAB — ECHOCARDIOGRAM COMPLETE
Area-P 1/2: 3.95 cm2
S' Lateral: 2.9 cm

## 2020-03-16 NOTE — Progress Notes (Signed)
Cardiology Office Note:   Date:  03/17/2020  NAME:  Alexander Gross    MRN: [id number] DOB:  06/29/1960   PCP:  Renford Dills, MD  Cardiologist:  No primary care provider on file.   Referring MD: Renford Dills, MD   Chief Complaint  Patient presents with  . Follow-up         History of Present Illness:   Alexander Gross is a 59 y.o. male with a hx of MV prolapse, mitral regurgitation, HTN, HLD who presents for follow-up. He was seen 03/03/2020 for SOB/chest pain concerning for unstable angina. Normal coronaries and normal LVEDP. Echo with normal LVEF and mild prolapse of the PMVL with mild regurgitation. Reports he is doing well since his heart catheterization.  Right radial site clean and dry without hematoma or bruit.  Left heart cath with normal coronaries.  EKG today shows normal sinus rhythm with no acute ischemic changes or evidence of prior infarction.  He still reports exertional shortness of breath and chest tightness.  Can occur with heavy exertion.  Echo was largely normal.  Blood pressure a bit elevated today.  I did inquire about blood pressure causing his symptoms.  He will start to keep an eye on it.  He denies any heart racing spells.  Lungs are clear.  Recent chest x-ray normal.  He reports he has had chronic cough as well.  He presents with his wife.  Apparently has had episodes of dizziness as well.  Cardiac testing appears to be normal.  We did discuss a heart monitor but he declines.  Overall, doing well.  Denies any angina.  Symptoms still present but no cardiac reason.   Problem List 1. Mitral valve prolapse -mild MR 03/15/2020 -normal LHC 03/08/2020 2. HTN 3. GERD  Past Medical History: Past Medical History:  Diagnosis Date  . GERD (gastroesophageal reflux disease)   . Headache    "daily since last Saturday" (10/27/2015)  . Hypertension   . Tick bite ~ 08/2015   bitten by multiple ticks about two months ago when hiking in [place]/notes 10/26/2015    Past  Surgical History: Past Surgical History:  Procedure Laterality Date  . LEFT HEART CATH AND CORONARY ANGIOGRAPHY N/A 03/08/2020   Procedure: LEFT HEART CATH AND CORONARY ANGIOGRAPHY;  Surgeon: Lyn Records, MD;  Location: [facility] INVASIVE CV LAB;  Service: Cardiovascular;  Laterality: N/A;  . TONSILLECTOMY      Current Medications: Current Meds  Medication Sig  . acetaminophen (TYLENOL) 500 MG tablet Take 1,000 mg by mouth every 4 (four) hours as needed for fever.   . CVS ASPIRIN ADULT LOW DOSE 81 MG chewable tablet Chew 81 mg by mouth daily.  Marland Kitchen doxycycline (VIBRAMYCIN) 100 MG capsule Take 100 mg by mouth daily.  Marland Kitchen losartan (COZAAR) 50 MG tablet Take 50 mg by mouth daily.  . metoprolol succinate (TOPROL-XL) 25 MG 24 hr tablet Take 25 mg by mouth daily.  . rosuvastatin (CRESTOR) 20 MG tablet Take 1 tablet (20 mg total) by mouth daily.  . [DISCONTINUED] nitroGLYCERIN (NITROSTAT) 0.4 MG SL tablet Place 0.4 mg under the tongue every 5 (five) minutes x 3 doses as needed for chest pain.   Current Facility-Administered Medications for the 03/17/20 encounter (Office Visit) with Sande Rives, MD  Medication  . sodium chloride flush (NS) 0.9 % injection 3 mL     Allergies:    Morphine   Social History: Social History   Socioeconomic History  . Marital status:  Married    Spouse name: Not on file  . Number of children: 2  . Years of education: Not on file  . Highest education level: Not on file  Occupational History  . Occupation: quality control   Tobacco Use  . Smoking status: Never Smoker  . Smokeless tobacco: Current User    Types: Snuff  Vaping Use  . Vaping Use: Never used  Substance and Sexual Activity  . Alcohol use: Yes    Alcohol/week: 12.0 standard drinks    Types: 12 Cans of beer per week  . Drug use: No  . Sexual activity: Not Currently    Partners: Female  Other Topics Concern  . Not on file  Social History Narrative   Right handed   Lives in a two story  home with wife.   Social Determinants of Health   Financial Resource Strain: Not on file  Food Insecurity: Not on file  Transportation Needs: Not on file  Physical Activity: Not on file  Stress: Not on file  Social Connections: Not on file     Family History: The patient's family history includes Cancer in his father; Heart disease in his mother; Migraines in his mother.  ROS:   All other ROS reviewed and negative. Pertinent positives noted in the HPI.     EKGs/Labs/Other Studies Reviewed:   The following studies were personally reviewed by me today:  EKG:  EKG is ordered today.  The ekg ordered today demonstrates normal sinus rhythm, heart rate 56, no acute ischemic changes, no evidence of prior infarction, and was personally reviewed by me.   LHC 03/08/2020   Normal coronary arteries.  Right dominant coronary anatomy.  Normal left ventricular function with EF 50%.  LVEDP 14 mmHg.  Echo 03/15/2020  1. The mitral valve is myxomatous. Mild mitral valve regurgitation. No  evidence of mitral stenosis. There is moderate holosystolic prolapse of  the medial scallop of the posterior leaflet of the mitral valve.  2. Left ventricular ejection fraction, by estimation, is 60 to 65%. Left  ventricular ejection fraction by 3D volume is 65 %. The left ventricle has  normal function. The left ventricle has no regional wall motion  abnormalities. Left ventricular diastolic  parameters were normal.  3. Right ventricular systolic function is normal. The right ventricular  size is normal. There is normal pulmonary artery systolic pressure. The  estimated right ventricular systolic pressure is 24.5 mmHg.  4. The aortic valve is tricuspid. Aortic valve regurgitation is trivial.  No aortic stenosis is present.  5. The inferior vena cava is normal in size with greater than 50%  respiratory variability, suggesting right atrial pressure of 3 mmHg.    Recent Labs: 03/03/2020: BUN 12;  Creatinine, Ser 0.85; Hemoglobin 16.4; Platelets 211; Potassium 5.0; Sodium 137   Recent Lipid Panel No results found for: CHOL, TRIG, HDL, CHOLHDL, VLDL, LDLCALC, LDLDIRECT  Physical Exam:   VS:  BP (!) 147/81   Pulse (!) 56   Ht 5\' 7"  (1.702 m)   Wt 177 lb 3.2 oz (80.4 kg)   SpO2 100%   BMI 27.75 kg/m    Wt Readings from Last 3 Encounters:  03/17/20 177 lb 3.2 oz (80.4 kg)  03/08/20 173 lb (78.5 kg)  03/03/20 174 lb 6.4 oz (79.1 kg)    General: Well nourished, well developed, in no acute distress Head: Atraumatic, normal size  Eyes: PEERLA, EOMI  Neck: Supple, no JVD Endocrine: No thryomegaly Cardiac: Normal S1, S2; RRR;  no murmurs, rubs, or gallops Lungs: Clear to auscultation bilaterally, no wheezing, rhonchi or rales  Abd: Soft, nontender, no hepatomegaly  Ext: No edema, pulses 2+ Musculoskeletal: No deformities, BUE and BLE strength normal and equal Skin: Warm and dry, no rashes   Neuro: Alert and oriented to person, place, time, and situation, CNII-XII grossly intact, no focal deficits  Psych: Normal mood and affect   ASSESSMENT:   Alexander Gross is a 59 y.o. male who presents for the following: 1. SOB (shortness of breath) on exertion   2. Mitral valve prolapse   3. Nonrheumatic mitral valve regurgitation     PLAN:   1. SOB (shortness of breath) on exertion -Presented with symptoms of worsening shortness of breath and chest tightness concerning for unstable angina.  Left heart catheterization showed normal coronary arteries.  He had normal LVEDP.  Echocardiogram with normal LV function and mild MR discussed below.  He reports no heart racing spells.  No strong cardiac reason for his symptoms.  I see no need for further cardiac testing at this point.  His lungs are clear and recent chest x-ray was normal.  I think this is a pulmonary issue.  His symptoms could be blood pressure related.  I would like for him to keep a log of his blood pressure.  He does describe  episodes of dizziness and lightheadedness.  Symptoms could be related to that.  He will see me once a year we will go over the results of this.  I will defer any pulmonary testing to Dr. Nehemiah Settle. -ok to stop aspirin. Would continue crestor as no issues.   2. Mitral valve prolapse 3. Nonrheumatic mitral valve regurgitation -He does have mitral valve prolapse of the posterior mitral valve leaflet with mild MR.  He has no murmur.  This does not explain his symptoms.  He will need to see me yearly for this.  He will need a repeat echocardiogram in 3 to 5 years.  Disposition: Return in about 1 year (around 03/17/2021).  Medication Adjustments/Labs and Tests Ordered: Current medicines are reviewed at length with the patient today.  Concerns regarding medicines are outlined above.  Orders Placed This Encounter  Procedures  . EKG 12-Lead   No orders of the defined types were placed in this encounter.   Patient Instructions  Medication Instructions:  The current medical regimen is effective;  continue present plan and medications.  *If you need a refill on your cardiac medications before your next appointment, please call your pharmacy*   Follow-Up: At [facility], you and your health needs are our priority.  As part of our continuing mission to provide you with exceptional heart care, we have created designated Provider Care Teams.  These Care Teams include your primary Cardiologist (physician) and Advanced Practice Providers (APPs -  Physician Assistants and Nurse Practitioners) who all work together to provide you with the care you need, when you need it.  We recommend signing up for the patient portal called "MyChart".  Sign up information is provided on this After Visit Summary.  MyChart is used to connect with patients for Virtual Visits (Telemedicine).  Patients are able to view lab/test results, encounter notes, upcoming appointments, etc.  Non-urgent messages can be sent to your  provider as well.   To learn more about what you can do with MyChart, go to [email or web address].    Your next appointment:   12 month(s)  The format for your next appointment:   In Person  Provider:   Lennie Odor, MD       Time Spent with Patient: I have spent a total of 25 minutes with patient reviewing hospital notes, telemetry, EKGs, labs and examining the patient as well as establishing an assessment and plan that was discussed with the patient.  > 50% of time was spent in direct patient care.  Signed, Lenna Gilford. Flora Lipps, MD [facility]  [address], [address] [place], [place] [address] [phone]  03/17/2020 11:36 AM

## 2020-03-17 ENCOUNTER — Ambulatory Visit (INDEPENDENT_AMBULATORY_CARE_PROVIDER_SITE_OTHER): Payer: BC Managed Care – PPO | Admitting: Cardiovascular Disease

## 2020-03-17 ENCOUNTER — Other Ambulatory Visit: Payer: Self-pay

## 2020-03-17 ENCOUNTER — Encounter: Payer: Self-pay | Admitting: Cardiovascular Disease

## 2020-03-17 VITALS — BP 147/81 | HR 56 | Ht 67.0 in | Wt 177.2 lb

## 2020-03-17 DIAGNOSIS — I341 Nonrheumatic mitral (valve) prolapse: Secondary | ICD-10-CM | POA: Diagnosis not present

## 2020-03-17 DIAGNOSIS — R0602 Shortness of breath: Secondary | ICD-10-CM | POA: Diagnosis not present

## 2020-03-17 DIAGNOSIS — I340 Nonrheumatic mitral (valve) insufficiency: Secondary | ICD-10-CM | POA: Diagnosis not present

## 2020-03-17 NOTE — Patient Instructions (Signed)

## 2020-10-05 DIAGNOSIS — M25811 Other specified joint disorders, right shoulder: Secondary | ICD-10-CM | POA: Diagnosis not present

## 2020-10-05 DIAGNOSIS — M19011 Primary osteoarthritis, right shoulder: Secondary | ICD-10-CM | POA: Diagnosis not present

## 2020-12-12 DIAGNOSIS — L719 Rosacea, unspecified: Secondary | ICD-10-CM | POA: Diagnosis not present

## 2020-12-23 ENCOUNTER — Other Ambulatory Visit: Payer: Self-pay | Admitting: Cardiovascular Disease

## 2021-01-10 DIAGNOSIS — S43121A Dislocation of right acromioclavicular joint, 100%-200% displacement, initial encounter: Secondary | ICD-10-CM | POA: Diagnosis not present

## 2021-01-10 DIAGNOSIS — M19011 Primary osteoarthritis, right shoulder: Secondary | ICD-10-CM | POA: Diagnosis not present

## 2021-01-12 ENCOUNTER — Other Ambulatory Visit: Payer: Self-pay | Admitting: Orthopedic Surgery

## 2021-01-12 DIAGNOSIS — M25611 Stiffness of right shoulder, not elsewhere classified: Secondary | ICD-10-CM

## 2021-01-12 DIAGNOSIS — M25511 Pain in right shoulder: Secondary | ICD-10-CM

## 2021-01-12 DIAGNOSIS — R531 Weakness: Secondary | ICD-10-CM

## 2021-01-18 DIAGNOSIS — N529 Male erectile dysfunction, unspecified: Secondary | ICD-10-CM | POA: Diagnosis not present

## 2021-01-18 DIAGNOSIS — B351 Tinea unguium: Secondary | ICD-10-CM | POA: Diagnosis not present

## 2021-01-18 DIAGNOSIS — M6740 Ganglion, unspecified site: Secondary | ICD-10-CM | POA: Diagnosis not present

## 2021-01-29 ENCOUNTER — Ambulatory Visit
Admission: RE | Admit: 2021-01-29 | Discharge: 2021-01-29 | Disposition: A | Discharge status: Home / Self Care | Payer: BC Managed Care – PPO | Source: Ambulatory Visit | Attending: Orthopedic Surgery | Admitting: Orthopedic Surgery

## 2021-01-29 DIAGNOSIS — M25611 Stiffness of right shoulder, not elsewhere classified: Secondary | ICD-10-CM

## 2021-01-29 DIAGNOSIS — M25511 Pain in right shoulder: Secondary | ICD-10-CM

## 2021-01-29 DIAGNOSIS — R531 Weakness: Secondary | ICD-10-CM

## 2021-01-29 IMAGING — MR MR SHOULDER*R* W/O CM
4 of 5 series · 21 of 40 positions shown · non-contrast
Comparison: None.

CLINICAL DATA: Right shoulder pain.  No prior surgery.

EXAM:
MRI OF THE RIGHT SHOULDER WITHOUT CONTRAST
TECHNIQUE: Multiplanar, multisequence MR imaging of the shoulder was performed.
No intravenous contrast was administered.

[Series 6: T2 fat-sat · axial · right · 3.0mm · 0.62mm/px · z∈[-52,+63]mm · 8 of 33 slices shown (1 of 3)]
[im 1/33]
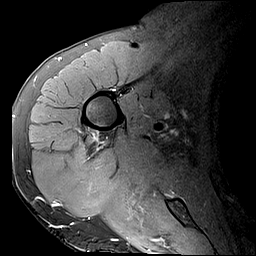
[im 4/33]
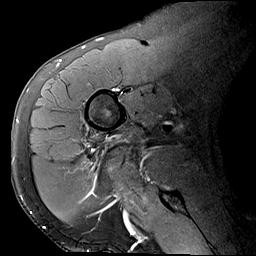
[im 11/33]
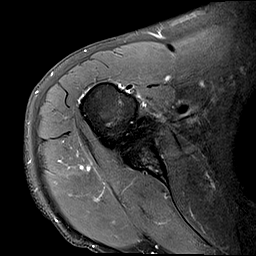
[im 15/33]
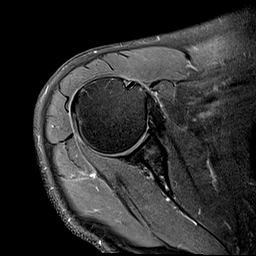
[im 18/33]
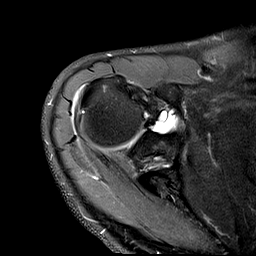
[im 22/33]
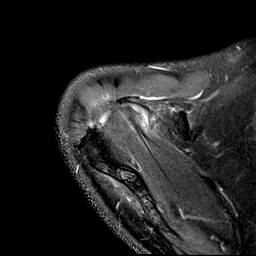
[im 29/33]
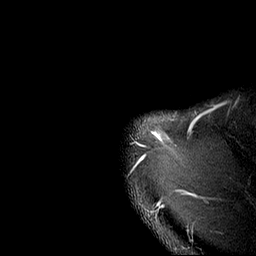
[im 33/33]
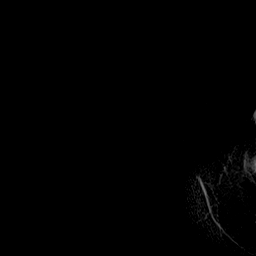

[Series 7: T2 fat-sat · oblique · right · 4.0mm · 0.23mm/px · 3 of 23 slices shown (2 of 3)]
[im 4/23]
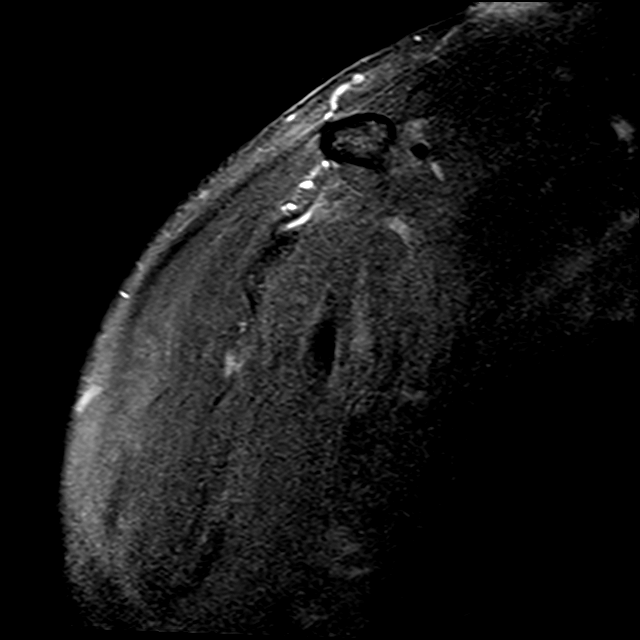
[im 12/23]
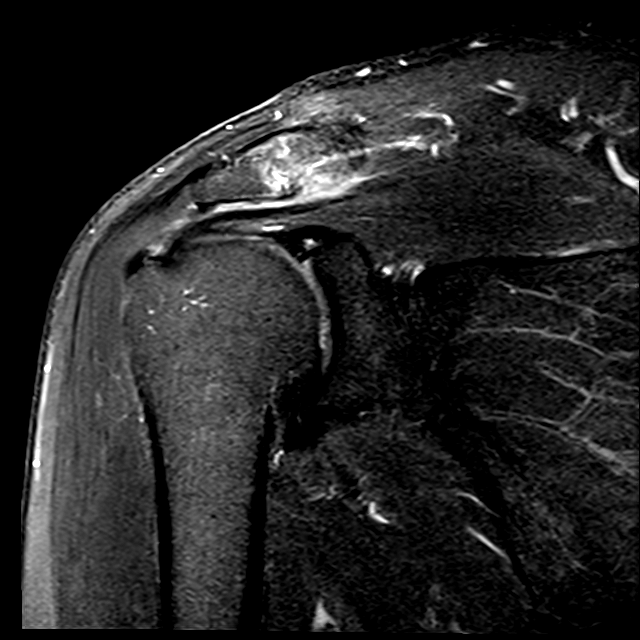
[im 19/23]
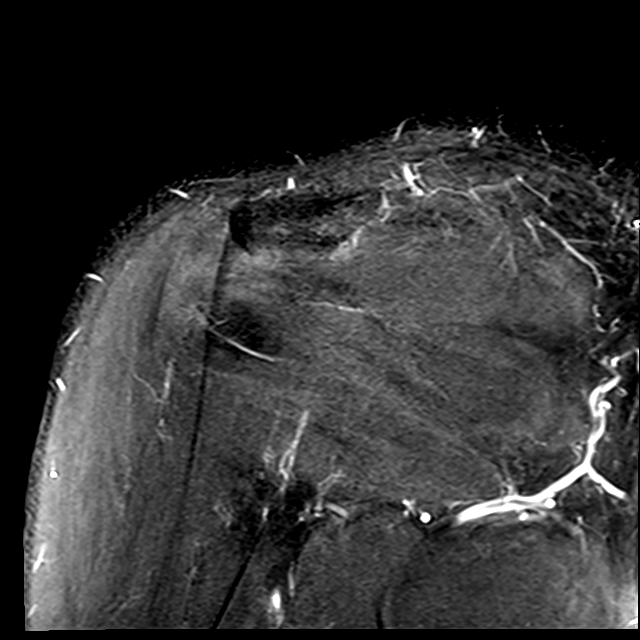

[Series 8: PD · oblique · right · 4.0mm · 0.23mm/px · 7 of 23 slices shown]
[im 1/23]
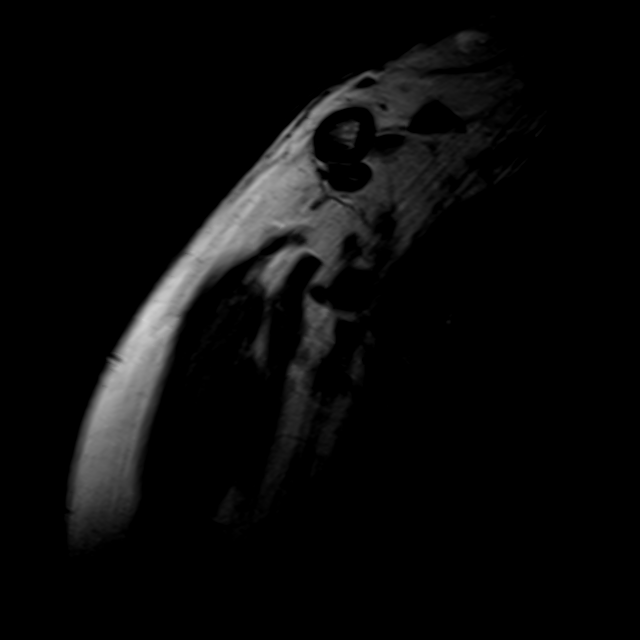
[im 4/23]
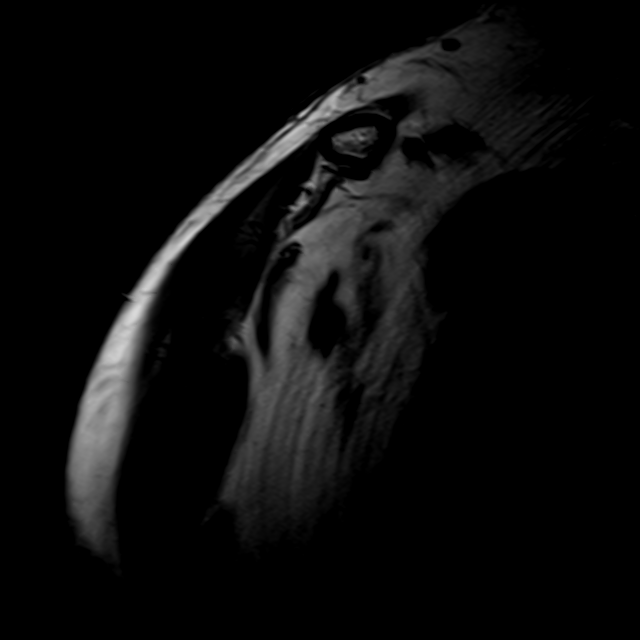
[im 8/23]
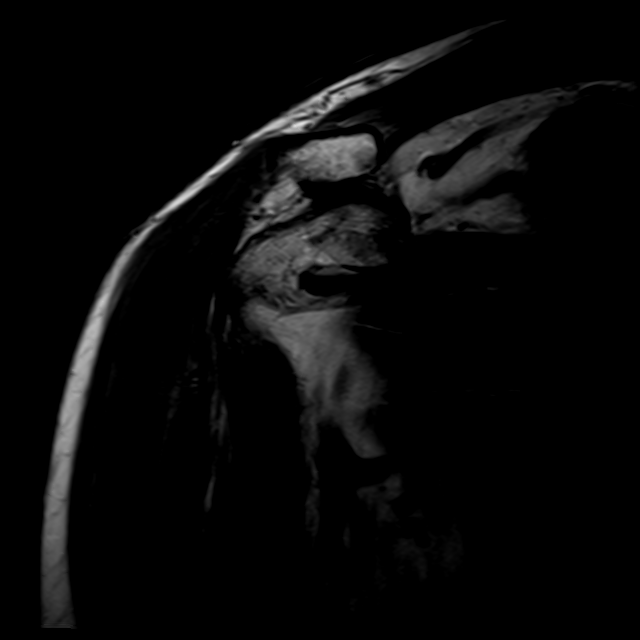
[im 12/23]
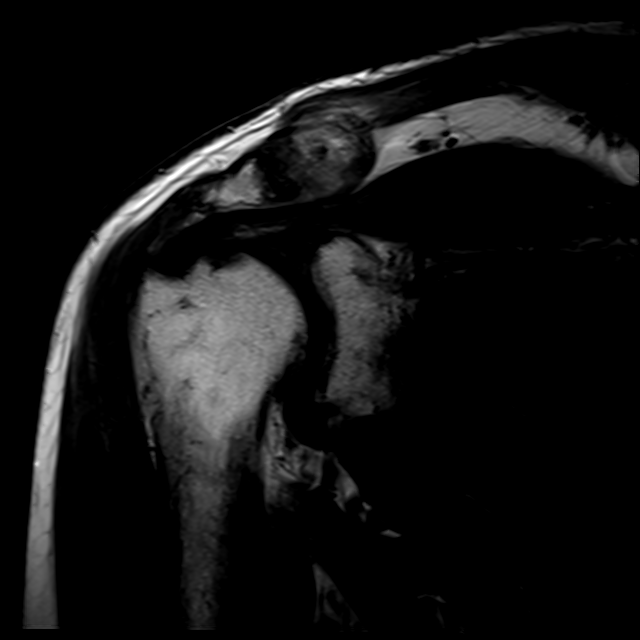
[im 15/23]
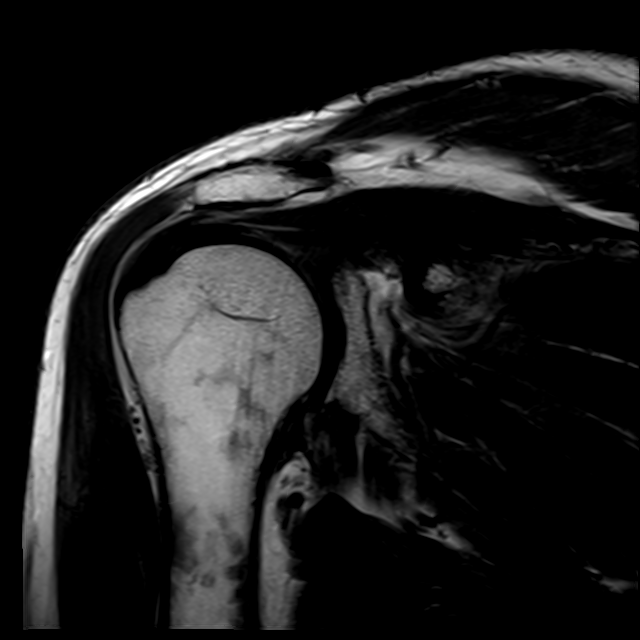
[im 19/23]
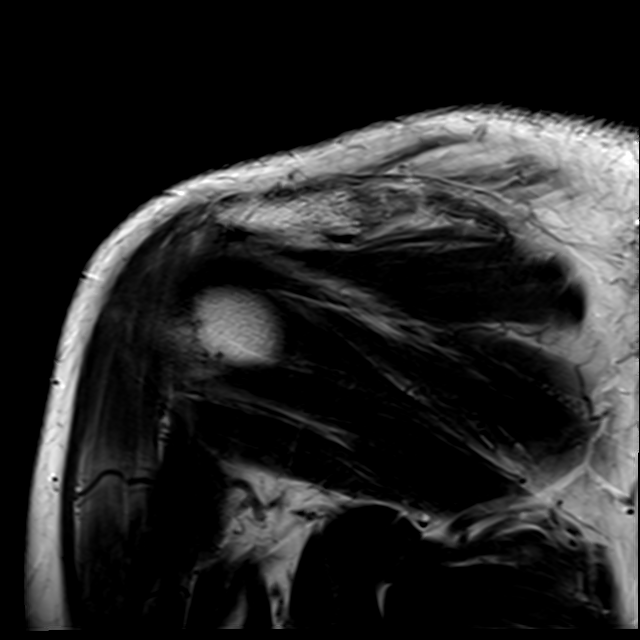
[im 23/23]
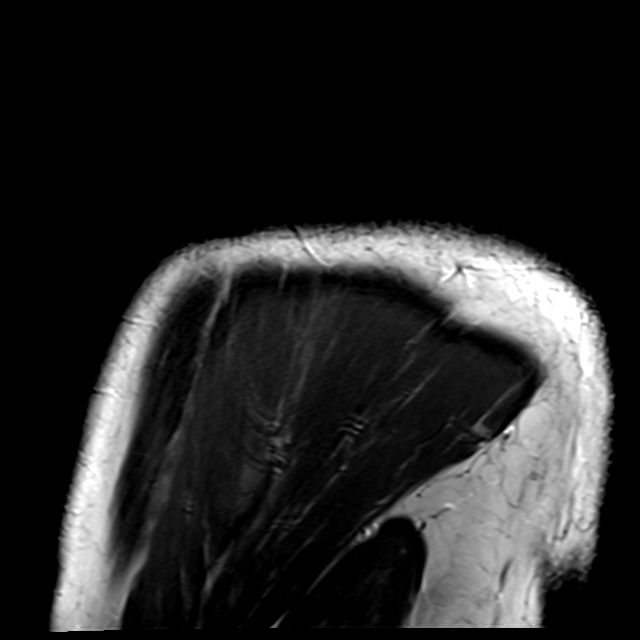

[Series 9: T2 fat-sat · oblique · right · 4.0mm · 0.50mm/px · 3 of 25 slices shown (3 of 3)]
[im 4/25]
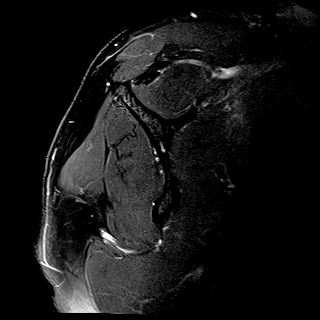
[im 14/25]
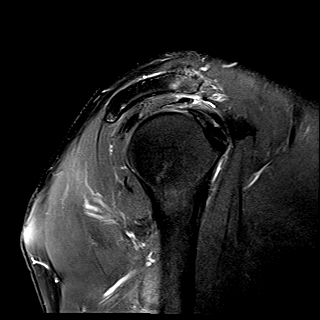
[im 21/25]
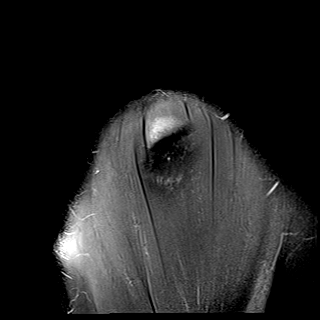

[21 of 40 positions shown; findings below may reference images not displayed]

FINDINGS: Rotator cuff: Mild tendinosis of the supraspinatus tendon with a
small partial-thickness bursal surface tear anteriorly. Moderate
tendinosis of the infraspinatus tendon. Teres minor tendon is
intact. Subscapularis tendon is intact.

Muscles: No muscle atrophy or edema. No intramuscular fluid
collection or hematoma.

Biceps Long Head: Mild tendinosis of the intra-articular portion of
the long head of the biceps tendon.

Acromioclavicular Joint: Severe arthropathy of the acromioclavicular
joint with marrow edema on either side of the joint. Trace
subacromial/subdeltoid bursal fluid.

Glenohumeral Joint: No joint effusion. No chondral defect.

Labrum: Grossly intact, but evaluation is limited by lack of
intraarticular fluid/contrast.

Bones: No fracture or dislocation. No aggressive osseous lesion.

Other: No fluid collection or hematoma.
IMPRESSION: 1. Mild tendinosis of the supraspinatus tendon with a small
partial-thickness bursal surface tear anteriorly.
2. Moderate tendinosis of the infraspinatus tendon.
3. Mild tendinosis of the intra-articular portion of the long head
of the biceps tendon.
4. Severe arthropathy of the acromioclavicular joint.

## 2021-02-22 DIAGNOSIS — M19011 Primary osteoarthritis, right shoulder: Secondary | ICD-10-CM | POA: Diagnosis not present

## 2021-02-22 DIAGNOSIS — M7541 Impingement syndrome of right shoulder: Secondary | ICD-10-CM | POA: Diagnosis not present

## 2021-02-22 DIAGNOSIS — G8918 Other acute postprocedural pain: Secondary | ICD-10-CM | POA: Diagnosis not present

## 2021-02-28 DIAGNOSIS — M25511 Pain in right shoulder: Secondary | ICD-10-CM | POA: Diagnosis not present

## 2021-02-28 DIAGNOSIS — Z9889 Other specified postprocedural states: Secondary | ICD-10-CM | POA: Diagnosis not present

## 2021-02-28 DIAGNOSIS — M25611 Stiffness of right shoulder, not elsewhere classified: Secondary | ICD-10-CM | POA: Diagnosis not present

## 2021-02-28 DIAGNOSIS — R6889 Other general symptoms and signs: Secondary | ICD-10-CM | POA: Diagnosis not present

## 2021-03-06 DIAGNOSIS — R21 Rash and other nonspecific skin eruption: Secondary | ICD-10-CM | POA: Diagnosis not present

## 2021-04-02 ENCOUNTER — Other Ambulatory Visit: Payer: Self-pay | Admitting: Cardiovascular Disease

## 2021-04-04 DIAGNOSIS — Z20822 Contact with and (suspected) exposure to covid-19: Secondary | ICD-10-CM | POA: Diagnosis not present

## 2021-04-04 DIAGNOSIS — U071 COVID-19: Secondary | ICD-10-CM | POA: Diagnosis not present

## 2021-04-06 DIAGNOSIS — U071 COVID-19: Secondary | ICD-10-CM | POA: Diagnosis not present

## 2021-04-06 DIAGNOSIS — Z20822 Contact with and (suspected) exposure to covid-19: Secondary | ICD-10-CM | POA: Diagnosis not present

## 2021-04-08 DIAGNOSIS — Z20822 Contact with and (suspected) exposure to covid-19: Secondary | ICD-10-CM | POA: Diagnosis not present

## 2021-04-08 DIAGNOSIS — R000 Tachycardia, unspecified: Secondary | ICD-10-CM | POA: Diagnosis not present

## 2021-04-08 DIAGNOSIS — U071 COVID-19: Secondary | ICD-10-CM | POA: Diagnosis not present

## 2021-04-10 DIAGNOSIS — Z20822 Contact with and (suspected) exposure to covid-19: Secondary | ICD-10-CM | POA: Diagnosis not present

## 2021-04-12 DIAGNOSIS — G9331 Postviral fatigue syndrome: Secondary | ICD-10-CM | POA: Diagnosis not present

## 2021-04-12 DIAGNOSIS — R059 Cough, unspecified: Secondary | ICD-10-CM | POA: Diagnosis not present

## 2021-04-19 DIAGNOSIS — R059 Cough, unspecified: Secondary | ICD-10-CM | POA: Diagnosis not present

## 2021-04-21 ENCOUNTER — Ambulatory Visit
Admission: RE | Admit: 2021-04-21 | Discharge: 2021-04-21 | Disposition: A | Discharge status: Home / Self Care | Payer: BC Managed Care – PPO | Source: Ambulatory Visit | Attending: Internal Medicine | Admitting: Internal Medicine

## 2021-04-21 ENCOUNTER — Other Ambulatory Visit: Payer: Self-pay | Admitting: Internal Medicine

## 2021-04-21 DIAGNOSIS — R059 Cough, unspecified: Secondary | ICD-10-CM

## 2021-04-21 IMAGING — CR DG CHEST 2V
2 series · 2 of 2 positions shown · non-contrast
Comparison: [DATE]

CLINICAL DATA: 60-year-old male with a history of cough

EXAM:
CHEST - 2 VIEW

[w chest pa]
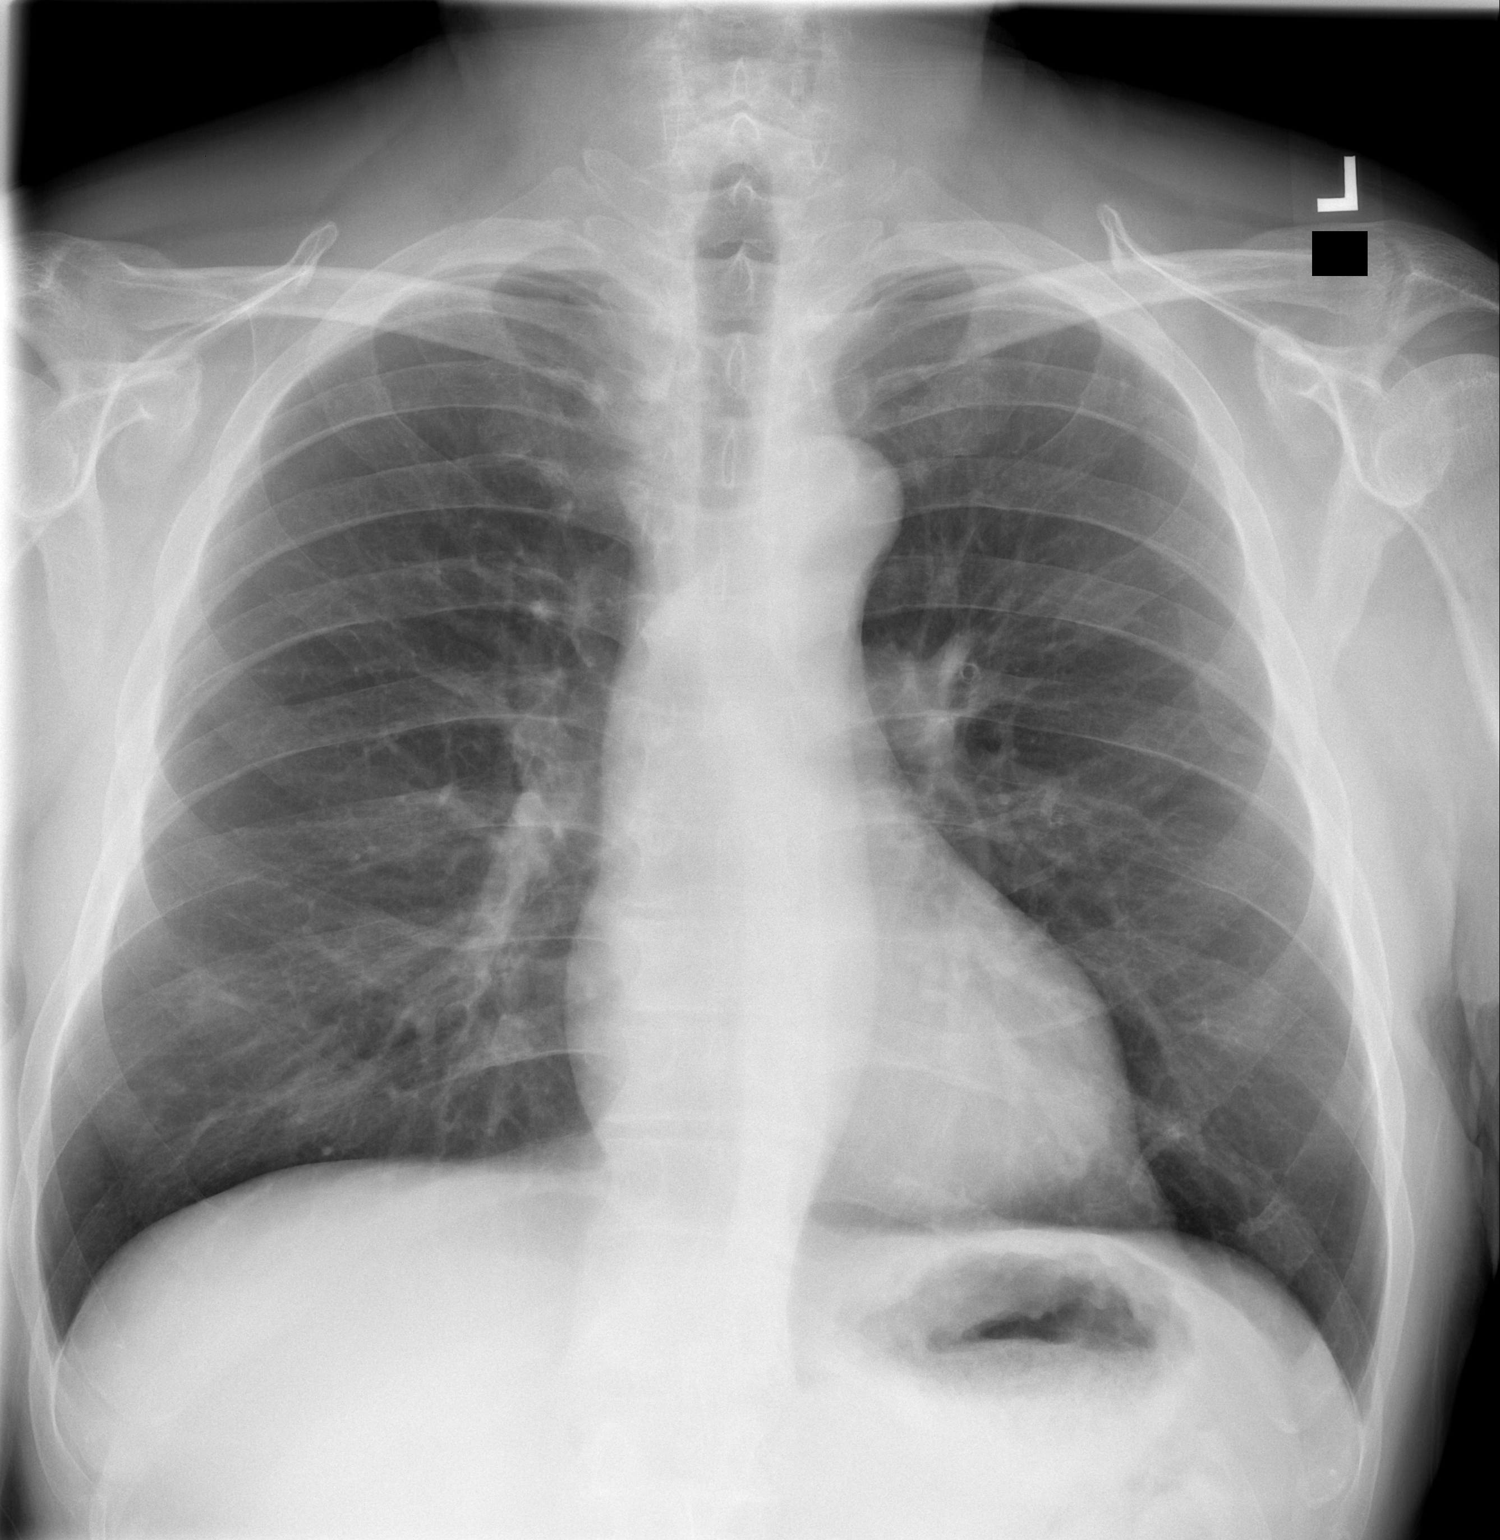

[w chest lat]
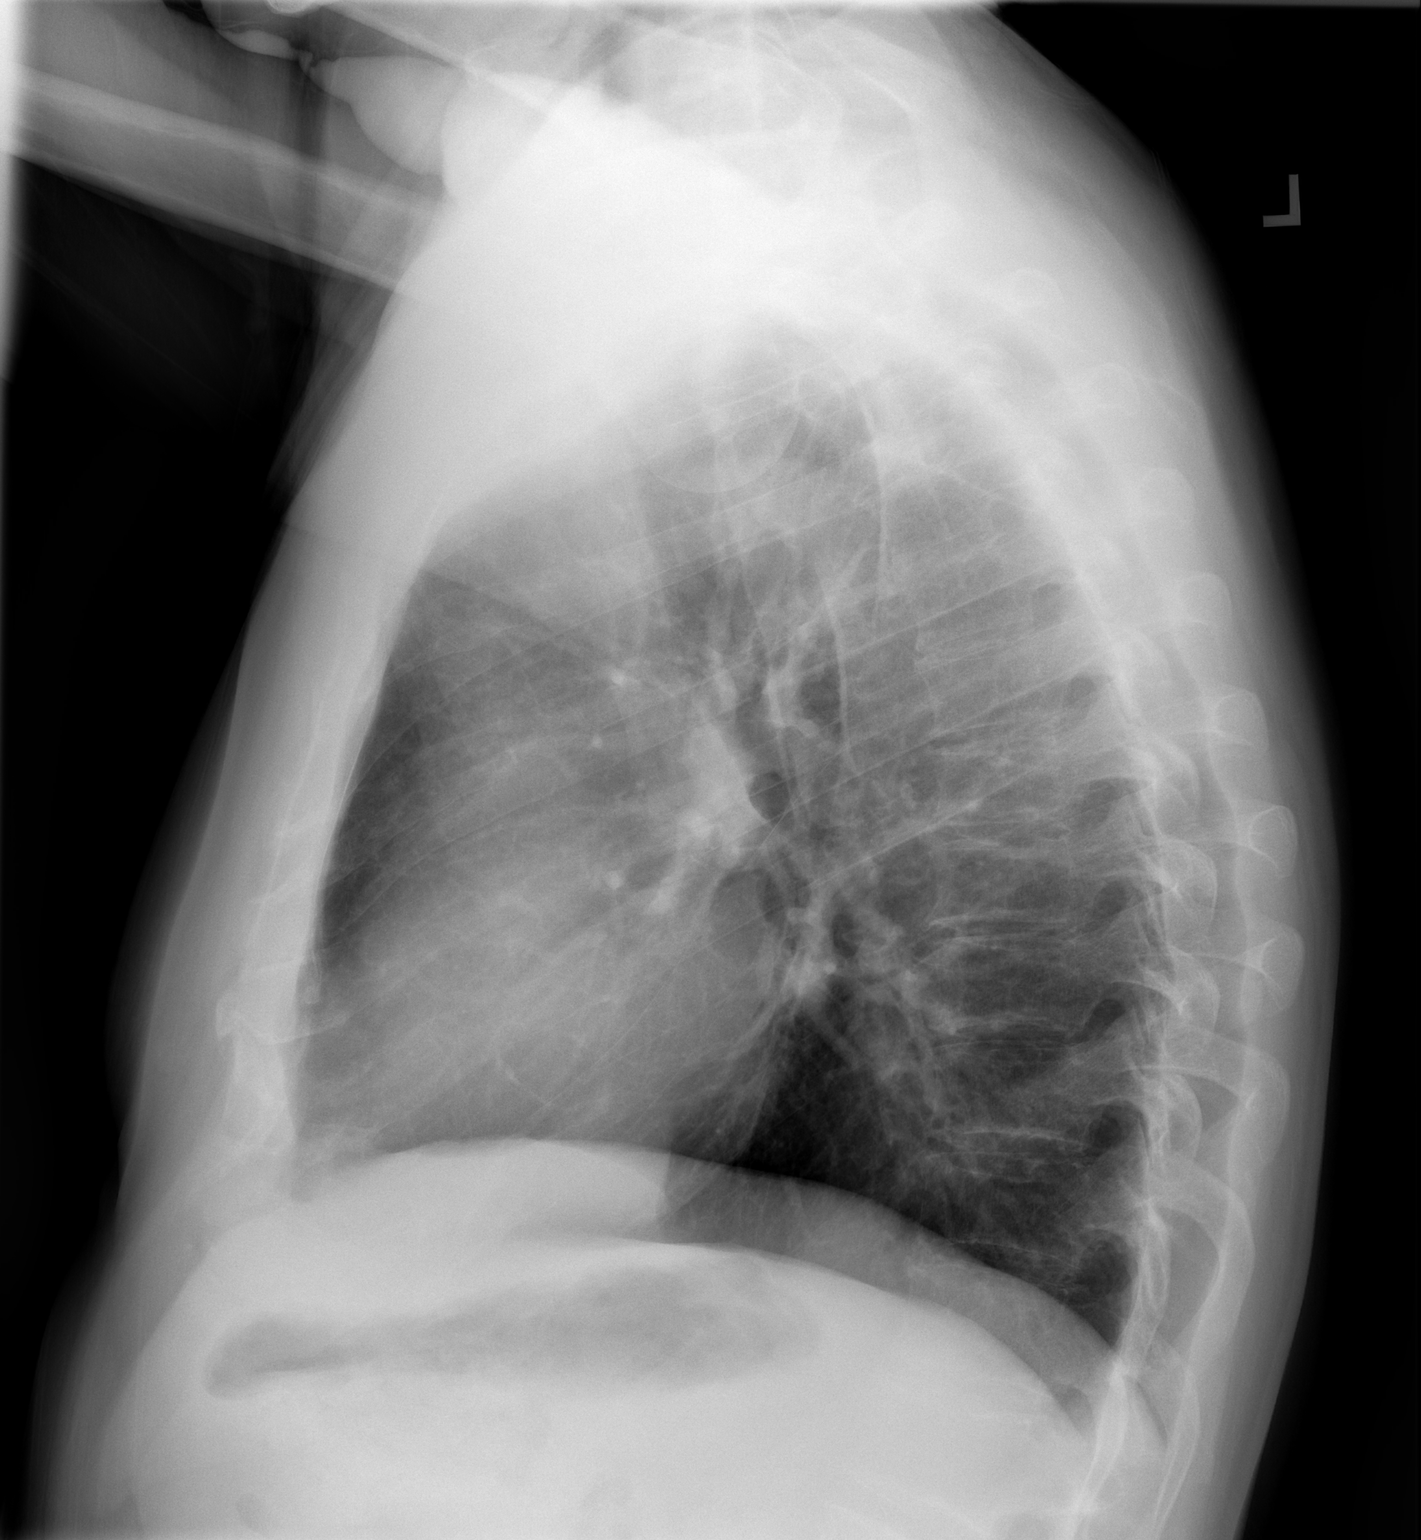

[2 of 2 positions shown; findings below may reference images not displayed]

FINDINGS: Cardiomediastinal silhouette unchanged in size and contour. No
evidence of central vascular congestion. No interlobular septal
thickening.

No pneumothorax or pleural effusion. Coarsened interstitial
markings, with no confluent airspace disease.

No acute displaced fracture. Degenerative changes of the spine.
IMPRESSION: No active cardiopulmonary disease.

## 2021-06-08 DIAGNOSIS — L111 Transient acantholytic dermatosis [Grover]: Secondary | ICD-10-CM | POA: Diagnosis not present

## 2021-06-23 ENCOUNTER — Other Ambulatory Visit: Payer: Self-pay | Admitting: Cardiovascular Disease

## 2021-08-13 ENCOUNTER — Other Ambulatory Visit: Payer: Self-pay | Admitting: Cardiovascular Disease

## 2021-09-07 ENCOUNTER — Other Ambulatory Visit: Payer: Self-pay | Admitting: Cardiovascular Disease

## 2021-09-18 DIAGNOSIS — D170 Benign lipomatous neoplasm of skin and subcutaneous tissue of head, face and neck: Secondary | ICD-10-CM | POA: Diagnosis not present

## 2021-09-27 DIAGNOSIS — D170 Benign lipomatous neoplasm of skin and subcutaneous tissue of head, face and neck: Secondary | ICD-10-CM | POA: Diagnosis not present

## 2021-09-27 DIAGNOSIS — R221 Localized swelling, mass and lump, neck: Secondary | ICD-10-CM | POA: Diagnosis not present

## 2021-10-02 ENCOUNTER — Other Ambulatory Visit: Payer: Self-pay | Admitting: Cardiovascular Disease

## 2021-10-14 ENCOUNTER — Other Ambulatory Visit: Payer: Self-pay | Admitting: Cardiovascular Disease

## 2021-11-07 ENCOUNTER — Other Ambulatory Visit: Payer: Self-pay | Admitting: Cardiovascular Disease

## 2021-11-21 DIAGNOSIS — M79671 Pain in right foot: Secondary | ICD-10-CM | POA: Diagnosis not present

## 2021-11-23 ENCOUNTER — Other Ambulatory Visit: Payer: Self-pay | Admitting: Cardiovascular Disease

## 2021-12-02 ENCOUNTER — Other Ambulatory Visit: Payer: Self-pay | Admitting: Cardiovascular Disease

## 2021-12-05 ENCOUNTER — Ambulatory Visit (INDEPENDENT_AMBULATORY_CARE_PROVIDER_SITE_OTHER): Payer: BC Managed Care – PPO

## 2021-12-05 ENCOUNTER — Ambulatory Visit: Payer: BC Managed Care – PPO | Admitting: Podiatry

## 2021-12-05 DIAGNOSIS — M722 Plantar fascial fibromatosis: Secondary | ICD-10-CM | POA: Diagnosis not present

## 2021-12-05 MED ORDER — TRIAMCINOLONE ACETONIDE 10 MG/ML IJ SUSP
10.0000 mg | Freq: Once | INTRAMUSCULAR | Status: AC
  Administered 2021-12-05: 10 mg

## 2021-12-05 MED ORDER — MELOXICAM 7.5 MG PO TABS: 7.5000 mg | ORAL_TABLET | Freq: Every day | ORAL | 0 refills | Status: AC | PRN

## 2021-12-05 NOTE — Patient Instructions (Addendum)
If was nice to meet you today. If you have any questions or any further concerns, please feel fee to give me a call. You can call our office at [phone] or please feel fee to send me a message through MyChart.    While at your visit today you received a steroid injection in your foot or ankle to help with your pain. Along with having the steroid medication there is some "numbing" medication in the shot that you received. Due to this you may notice some numbness to the area for the next couple of hours.   I would recommend limiting activity for the next few days to help the steroid injection take affect.    The actually benefit from the steroid injection may take up to 2-7 days to see a difference. You may actually experience a small (as in 10%) INCREASE in pain in the first 24 hours---that is common. It would be best if you can ice the area today and take anti-inflammatory medications (such as Ibuprofen, Motrin, or Aleve) if you are able to take these medications. If you were prescribed another medication to help with the pain go ahead and start that medication today    Things to watch out for that you should contact us or a health care provider urgently would include: 1. Unusual (as in more than 10%) increase in pain 2. New fever > 101.5 3. New swelling or redness of the injected area.  4. Streaking of red lines around the area injected.  If you have any questions or concerns about this, please give our office a call at [phone].     Plantar Fasciitis (Heel Spur Syndrome) with Rehab The plantar fascia is a fibrous, ligament-like, soft-tissue structure that spans the bottom of the foot. Plantar fasciitis is a condition that causes pain in the foot due to inflammation of the tissue. SYMPTOMS  Pain and tenderness on the underneath side of the foot. Pain that worsens with standing or walking. CAUSES  Plantar fasciitis is caused by irritation and injury to the plantar fascia on the  underneath side of the foot. Common mechanisms of injury include: Direct trauma to bottom of the foot. Damage to a small nerve that runs under the foot where the main fascia attaches to the heel bone. Stress placed on the plantar fascia due to bone spurs. RISK INCREASES WITH:  Activities that place stress on the plantar fascia (running, jumping, pivoting, or cutting). Poor strength and flexibility. Improperly fitted shoes. Tight calf muscles. Flat feet. Failure to warm-up properly before activity. Obesity. PREVENTION Warm up and stretch properly before activity. Allow for adequate recovery between workouts. Maintain physical fitness: Strength, flexibility, and endurance. Cardiovascular fitness. Maintain a health body weight. Avoid stress on the plantar fascia. Wear properly fitted shoes, including arch supports for individuals who have flat feet.  PROGNOSIS  If treated properly, then the symptoms of plantar fasciitis usually resolve without surgery. However, occasionally surgery is necessary.  RELATED COMPLICATIONS  Recurrent symptoms that may result in a chronic condition. Problems of the lower back that are caused by compensating for the injury, such as limping. Pain or weakness of the foot during push-off following surgery. Chronic inflammation, scarring, and partial or complete fascia tear, occurring more often from repeated injections.  TREATMENT  Treatment initially involves the use of ice and medication to help reduce pain and inflammation. The use of strengthening and stretching exercises may help reduce pain with activity, especially stretches of the Achilles tendon. These exercises  may be performed at home or with a therapist. Your caregiver may recommend that you use heel cups of arch supports to help reduce stress on the plantar fascia. Occasionally, corticosteroid injections are given to reduce inflammation. If symptoms persist for greater than 6 months despite  non-surgical (conservative), then surgery may be recommended.   MEDICATION  If pain medication is necessary, then nonsteroidal anti-inflammatory medications, such as aspirin and ibuprofen, or other minor pain relievers, such as acetaminophen, are often recommended. Do not take pain medication within 7 days before surgery. Prescription pain relievers may be given if deemed necessary by your caregiver. Use only as directed and only as much as you need. Corticosteroid injections may be given by your caregiver. These injections should be reserved for the most serious cases, because they may only be given a certain number of times.  HEAT AND COLD Cold treatment (icing) relieves pain and reduces inflammation. Cold treatment should be applied for 10 to 15 minutes every 2 to 3 hours for inflammation and pain and immediately after any activity that aggravates your symptoms. Use ice packs or massage the area with a piece of ice (ice massage). Heat treatment may be used prior to performing the stretching and strengthening activities prescribed by your caregiver, physical therapist, or athletic trainer. Use a heat pack or soak the injury in warm water.  SEEK IMMEDIATE MEDICAL CARE IF: Treatment seems to offer no benefit, or the condition worsens. Any medications produce adverse side effects.  EXERCISES- RANGE OF MOTION (ROM) AND STRETCHING EXERCISES - Plantar Fasciitis (Heel Spur Syndrome) These exercises may help you when beginning to rehabilitate your injury. Your symptoms may resolve with or without further involvement from your physician, physical therapist or athletic trainer. While completing these exercises, remember:  Restoring tissue flexibility helps normal motion to return to the joints. This allows healthier, less painful movement and activity. An effective stretch should be held for at least 30 seconds. A stretch should never be painful. You should only feel a gentle lengthening or release in  the stretched tissue.  RANGE OF MOTION - Toe Extension, Flexion Sit with your right / left leg crossed over your opposite knee. Grasp your toes and gently pull them back toward the top of your foot. You should feel a stretch on the bottom of your toes and/or foot. Hold this stretch for 10 seconds. Now, gently pull your toes toward the bottom of your foot. You should feel a stretch on the top of your toes and or foot. Hold this stretch for 10 seconds. Repeat  times. Complete this stretch 3 times per day.   RANGE OF MOTION - Ankle Dorsiflexion, Active Assisted Remove shoes and sit on a chair that is preferably not on a carpeted surface. Place right / left foot under knee. Extend your opposite leg for support. Keeping your heel down, slide your right / left foot back toward the chair until you feel a stretch at your ankle or calf. If you do not feel a stretch, slide your bottom forward to the edge of the chair, while still keeping your heel down. Hold this stretch for 10 seconds. Repeat 3 times. Complete this stretch 2 times per day.   STRETCH  Gastroc, Standing Place hands on wall. Extend right / left leg, keeping the front knee somewhat bent. Slightly point your toes inward on your back foot. Keeping your right / left heel on the floor and your knee straight, shift your weight toward the wall, not allowing your  arch. You should feel a gentle stretch in the right / left calf. Hold this position for 10 seconds. Repeat 3 times. Complete this stretch 2 times per day.  STRETCH  Soleus, Standing Place hands on wall. Extend right / left leg, keeping the other knee somewhat bent. Slightly point your toes inward on your back foot. Keep your right / left heel on the floor, bend your back knee, and slightly shift your weight over the back leg so that you feel a gentle stretch deep in your back calf. Hold this position for 10 seconds. Repeat 3 times. Complete this stretch 2 times per  day.  STRETCH  Gastrocsoleus, Standing  Note: This exercise can place a lot of stress on your foot and ankle. Please complete this exercise only if specifically instructed by your caregiver.  Place the ball of your right / left foot on a step, keeping your other foot firmly on the same step. Hold on to the wall or a rail for balance. Slowly lift your other foot, allowing your body weight to press your heel down over the edge of the step. You should feel a stretch in your right / left calf. Hold this position for 10 seconds. Repeat this exercise with a slight bend in your right / left knee. Repeat 3 times. Complete this stretch 2 times per day.   STRENGTHENING EXERCISES - Plantar Fasciitis (Heel Spur Syndrome)  These exercises may help you when beginning to rehabilitate your injury. They may resolve your symptoms with or without further involvement from your physician, physical therapist or athletic trainer. While completing these exercises, remember:  Muscles can gain both the endurance and the strength needed for everyday activities through controlled exercises. Complete these exercises as instructed by your physician, physical therapist or athletic trainer. Progress the resistance and repetitions only as guided.  STRENGTH - Towel Curls Sit in a chair positioned on a non-carpeted surface. Place your foot on a towel, keeping your heel on the floor. Pull the towel toward your heel by only curling your toes. Keep your heel on the floor. Repeat 3 times. Complete this exercise 2 times per day.  STRENGTH - Ankle Inversion Secure one end of a rubber exercise band/tubing to a fixed object (table, pole). Loop the other end around your foot just before your toes. Place your fists between your knees. This will focus your strengthening at your ankle. Slowly, pull your big toe up and in, making sure the band/tubing is positioned to resist the entire motion. Hold this position for 10 seconds. Have  your muscles resist the band/tubing as it slowly pulls your foot back to the starting position. Repeat 3 times. Complete this exercises 2 times per day.  Document Released: 03/12/2005 Document Revised: 06/04/2011 Document Reviewed: 06/24/2008 [employer] Patient Information 2014 [employer].  

## 2021-12-05 NOTE — Progress Notes (Signed)
Subjective:   Patient ID: Alexander Gross, male   DOB: 61 y.o.   MRN: [id number]   HPI Chief Complaint  Patient presents with   plantar fibroma    Patient came in today with 2 knots in the arch of each foot which started , rate of pain 3 out of 10, X-rays done today,     61 year old male presents with above concerns.  He states has been on about 6 months in the area is tender with pressure.  No injury.  That he reports.  He saw his primary care doctor who referred him here for further evaluation.     Review of Systems  All other systems reviewed and are negative.  Past Medical History:  Diagnosis Date   GERD (gastroesophageal reflux disease)    Headache    "daily since last Saturday" (10/27/2015)   Hypertension    Tick bite ~ 08/2015   bitten by multiple ticks about two months ago when hiking in [place]/notes 10/26/2015    Past Surgical History:  Procedure Laterality Date   LEFT HEART CATH AND CORONARY ANGIOGRAPHY N/A 03/08/2020   Procedure: LEFT HEART CATH AND CORONARY ANGIOGRAPHY;  Surgeon: Lyn Records, MD;  Location: [facility];  Service: Cardiovascular;  Laterality: N/A;   TONSILLECTOMY       Current Outpatient Medications:    meloxicam (MOBIC) 7.5 MG tablet, Take 1 tablet (7.5 mg total) by mouth daily as needed for pain., Disp: 30 tablet, Rfl: 0   acetaminophen (TYLENOL) 500 MG tablet, Take 1,000 mg by mouth every 4 (four) hours as needed for fever. , Disp: , Rfl:    [employer] ASPIRIN ADULT LOW DOSE 81 MG chewable tablet, Chew 81 mg by mouth daily., Disp: , Rfl:    doxycycline (VIBRAMYCIN) 100 MG capsule, Take 100 mg by mouth daily., Disp: , Rfl:    losartan (COZAAR) 50 MG tablet, Take 50 mg by mouth daily., Disp: , Rfl:    metoprolol succinate (TOPROL-XL) 25 MG 24 hr tablet, Take 25 mg by mouth daily., Disp: , Rfl:    rosuvastatin (CRESTOR) 20 MG tablet, TAKE 1 TABLET BY MOUTH DAILY. PATIENT MUST SCHEDULE APPOINTMENT FOR FUTURE REFILLS LAST ATTEMPT, Disp: 15 tablet,  Rfl: 0  Current Facility-Administered Medications:    sodium chloride flush (NS) 0.9 % injection 3 mL, 3 mL, Intravenous, Q12H, O'Neal, Ronnald Ramp, MD  Allergies  Allergen Reactions   Morphine Nausea And Vomiting          Objective:  Physical Exam  General: AAO x3, NAD  Dermatological: Skin is warm, dry and supple bilateral. There are no open sores, no preulcerative lesions, no rash or signs of infection present.  Vascular: Dorsalis Pedis artery and Posterior Tibial artery pedal pulses are 2/4 bilateral with immedate capillary fill time.  There is no pain with calf compression, swelling, warmth, erythema.   Neruologic: Grossly intact via light touch bilateral..   Musculoskeletal: On the medial band of the plantar fascia in the arch of the peritenon mobile soft tissue masses which are firm consistent with plantar fibromas.  Tenderness to palpation of the lesions.  No other areas of discomfort.  No area pinpoint tenderness.  Muscular strength 5/5 in all groups tested bilateral.  Gait: Unassisted, Nonantalgic.       Assessment:   Plantar fibromatosis bilaterally     Plan:  -Treatment options discussed including all alternatives, risks, and complications. -Etiology of symptoms were discussed -X-rays were obtained and reviewed with the patient.  3 views bilateral feet were obtained.  No evidence of acute fracture or calcifications. -Steroid injection performed bilaterally.  Skin was cleaned alcohol mixture 1 cc Kenalog 10, 0.5 cc of Marcaine plain, 0.5 cc of lidocaine plain was infiltrated into the soft tissue masses bilaterally without complications.  Postinjection care discussed. -I [facility] a compound cream through [facility] to help with scarring, plantar fibroma. -Anti-inflammatories as needed -Stretching exercises and supportive shoe gear.  Vivi Barrack DPM

## 2021-12-17 ENCOUNTER — Other Ambulatory Visit: Payer: Self-pay | Admitting: Cardiovascular Disease

## 2022-01-21 ENCOUNTER — Other Ambulatory Visit: Payer: Self-pay

## 2022-01-21 ENCOUNTER — Emergency Department (HOSPITAL_COMMUNITY)
Admission: EM | Admit: 2022-01-21 | Discharge: 2022-01-21 | Discharge status: Against Medical Advice | Payer: BC Managed Care – PPO | Attending: Emergency Medicine | Admitting: Emergency Medicine

## 2022-01-21 ENCOUNTER — Emergency Department (HOSPITAL_COMMUNITY): Payer: BC Managed Care – PPO

## 2022-01-21 DIAGNOSIS — I10 Essential (primary) hypertension: Secondary | ICD-10-CM | POA: Diagnosis not present

## 2022-01-21 DIAGNOSIS — R0789 Other chest pain: Secondary | ICD-10-CM | POA: Insufficient documentation

## 2022-01-21 DIAGNOSIS — R0602 Shortness of breath: Secondary | ICD-10-CM | POA: Insufficient documentation

## 2022-01-21 DIAGNOSIS — Z5321 Procedure and treatment not carried out due to patient leaving prior to being seen by health care provider: Secondary | ICD-10-CM | POA: Insufficient documentation

## 2022-01-21 DIAGNOSIS — R079 Chest pain, unspecified: Secondary | ICD-10-CM | POA: Diagnosis not present

## 2022-01-21 DIAGNOSIS — Z7982 Long term (current) use of aspirin: Secondary | ICD-10-CM | POA: Insufficient documentation

## 2022-01-21 DIAGNOSIS — R110 Nausea: Secondary | ICD-10-CM | POA: Insufficient documentation

## 2022-01-21 DIAGNOSIS — R002 Palpitations: Secondary | ICD-10-CM | POA: Diagnosis not present

## 2022-01-21 LAB — CBC
HCT: 40.7 % (ref 39.0–52.0)
Hemoglobin: 14.5 g/dL (ref 13.0–17.0)
MCH: 33.6 pg (ref 26.0–34.0)
MCHC: 35.6 g/dL (ref 30.0–36.0)
MCV: 94.4 fL (ref 80.0–100.0)
Platelets: 196 10*3/uL (ref 150–400)
RBC: 4.31 MIL/uL (ref 4.22–5.81)
RDW: 11.9 % (ref 11.5–15.5)
WBC: 4.8 10*3/uL (ref 4.0–10.5)
nRBC: 0 % (ref 0.0–0.2)

## 2022-01-21 LAB — BASIC METABOLIC PANEL
Anion gap: 11 (ref 5–15)
BUN: 8 mg/dL (ref 6–20)
CO2: 22 mmol/L (ref 22–32)
Calcium: 9.1 mg/dL (ref 8.9–10.3)
Chloride: 100 mmol/L (ref 98–111)
Creatinine, Ser: 0.74 mg/dL (ref 0.61–1.24)
GFR, Estimated: >60 mL/min (ref >60)
Glucose, Bld: 115 mg/dL — ABNORMAL HIGH (ref 70–99)
Potassium: 3.5 mmol/L (ref 3.5–5.1)
Sodium: 133 mmol/L — ABNORMAL LOW (ref 135–145)

## 2022-01-21 LAB — TROPONIN I (HIGH SENSITIVITY): Troponin I (High Sensitivity): 4 ng/L (ref <18)

## 2022-01-21 MED ORDER — NITROGLYCERIN 0.4 MG/SPRAY TL SOLN: 1.0000 | Status: DC | PRN

## 2022-01-21 NOTE — ED Triage Notes (Signed)
Pt here via [facility] from home for sudden onset palpitations w/ shob and lightheadedness while watching TV at home. Per wife, pt was pale and diaphoretic, she checked his pulse and it was weak and thready. Episode lasted approx 1 min, then pt c/o heaviness in chest. Ems gave 324mg  ASA and 1SL nitro w/ relief. Pt  c/o nausea after riding in ambulance. 118/90, 84HR,  cbg 134, 96%, 18g LAC

## 2022-01-21 NOTE — ED Provider Triage Note (Signed)
Emergency Medicine Provider Triage Evaluation Note  Alexander Gross , a 61 y.o. male  was evaluated in triage.  Pt complains of chest heaviness.  Patient reportedly started feeling strange with some chest heaviness approximate 2 hours prior to arrival and the patient's wife noted the patient had an irregular heartbeat.  He was also reportedly gray in color at the time.  EMS was called.  Sinus rhythm on EMS EKG.  He was given 324 of aspirin and 1 dose of nitroglycerin.  The patient reportedly felt somewhat better after the dose of nitroglycerin.  Patient with history of left heart cath in 2021 with reportedly no blockages.  He does endorse mild nausea during transport but wonders if that was due to riding backwards in an ambulance.  Denies any radiation of symptoms at this time.  Does endorse mild shortness of breath  Review of Systems  Positive: As above Negative: As above  Physical Exam  BP 111/71 (BP Location: Right Arm)   Pulse 71   Temp 98.5 F (36.9 C)   Resp 18   SpO2 97%  Gen:   Awake, no distress   Resp:  Normal effort  MSK:   Moves extremities without difficulty  Other:    Medical Decision Making  Medically screening exam initiated at 9:29 PM.  Appropriate orders placed.  Andria Meuse was informed that the remainder of the evaluation will be completed by another provider, this initial triage assessment does not replace that evaluation, and the importance of remaining in the ED until their evaluation is complete.     Ronny Bacon 01/21/22 2138

## 2022-01-21 NOTE — ED Notes (Signed)
IV removed by this Probation officer. Patient states the wait is too long and he is leaving

## 2022-01-23 ENCOUNTER — Emergency Department (HOSPITAL_COMMUNITY)
Admission: EM | Admit: 2022-01-23 | Discharge: 2022-01-23 | Disposition: A | Discharge status: Home / Self Care | Payer: BC Managed Care – PPO | Attending: Emergency Medicine | Admitting: Emergency Medicine

## 2022-01-23 ENCOUNTER — Emergency Department (HOSPITAL_COMMUNITY): Payer: BC Managed Care – PPO

## 2022-01-23 ENCOUNTER — Other Ambulatory Visit: Payer: Self-pay

## 2022-01-23 ENCOUNTER — Encounter (HOSPITAL_COMMUNITY): Payer: Self-pay

## 2022-01-23 DIAGNOSIS — R0602 Shortness of breath: Secondary | ICD-10-CM | POA: Insufficient documentation

## 2022-01-23 DIAGNOSIS — Z7982 Long term (current) use of aspirin: Secondary | ICD-10-CM | POA: Insufficient documentation

## 2022-01-23 DIAGNOSIS — I700 Atherosclerosis of aorta: Secondary | ICD-10-CM | POA: Diagnosis not present

## 2022-01-23 DIAGNOSIS — R0789 Other chest pain: Secondary | ICD-10-CM | POA: Diagnosis not present

## 2022-01-23 DIAGNOSIS — I10 Essential (primary) hypertension: Secondary | ICD-10-CM | POA: Diagnosis not present

## 2022-01-23 DIAGNOSIS — R002 Palpitations: Secondary | ICD-10-CM | POA: Insufficient documentation

## 2022-01-23 DIAGNOSIS — Z79899 Other long term (current) drug therapy: Secondary | ICD-10-CM | POA: Diagnosis not present

## 2022-01-23 DIAGNOSIS — R079 Chest pain, unspecified: Secondary | ICD-10-CM

## 2022-01-23 LAB — BASIC METABOLIC PANEL
Anion gap: 13 (ref 5–15)
BUN: 7 mg/dL (ref 6–20)
CO2: 23 mmol/L (ref 22–32)
Calcium: 9.6 mg/dL (ref 8.9–10.3)
Chloride: 102 mmol/L (ref 98–111)
Creatinine, Ser: 0.9 mg/dL (ref 0.61–1.24)
GFR, Estimated: >60 mL/min (ref >60)
Glucose, Bld: 102 mg/dL — ABNORMAL HIGH (ref 70–99)
Potassium: 4.5 mmol/L (ref 3.5–5.1)
Sodium: 138 mmol/L (ref 135–145)

## 2022-01-23 LAB — CBC
HCT: 44.5 % (ref 39.0–52.0)
Hemoglobin: 15.3 g/dL (ref 13.0–17.0)
MCH: 32.9 pg (ref 26.0–34.0)
MCHC: 34.4 g/dL (ref 30.0–36.0)
MCV: 95.7 fL (ref 80.0–100.0)
Platelets: 199 10*3/uL (ref 150–400)
RBC: 4.65 MIL/uL (ref 4.22–5.81)
RDW: 12 % (ref 11.5–15.5)
WBC: 3.6 10*3/uL — ABNORMAL LOW (ref 4.0–10.5)
nRBC: 0 % (ref 0.0–0.2)

## 2022-01-23 LAB — TROPONIN I (HIGH SENSITIVITY)
Troponin I (High Sensitivity): <2 ng/L (ref <18)
Troponin I (High Sensitivity): 2 ng/L (ref <18)

## 2022-01-23 MED ORDER — IOHEXOL 350 MG/ML SOLN
65.0000 mL | Freq: Once | INTRAVENOUS | Status: AC | PRN
  Administered 2022-01-23: 65 mL via INTRAVENOUS

## 2022-01-23 NOTE — Discharge Instructions (Addendum)
Your work-up in the emergency department is reassuring.  Your heart enzymes are undetectable.  Your CT scan did not show any new findings.  You do have some new EKG changes when compared to 2 years ago.  Additionally, on the cardiac monitor, there was a brief episode where you may have had an atrial flutter rhythm but maintained a normal heart rate.  Because of this as well as your recent symptoms, you should follow-up in the cardiology office.  A referral was sent and you should hear from them in the next couple days.  If you do not, please call the number below to set up that appointment.  Please return to the emergency department at any time for any new or worsening symptoms of concern.

## 2022-01-23 NOTE — ED Provider Notes (Signed)
[facility] EMERGENCY DEPARTMENT Provider Note   CSN: [id number] Arrival date & time: 01/23/22  [id number]     History  Chief Complaint  Patient presents with   Chest Pain    Alexander Gross is a 61 y.o. male.   Chest Pain Associated symptoms: palpitations and shortness of breath   Patient presents for chest discomfort.  2 days ago, he began to experience a chest heaviness, mild shortness of breath, and palpitations.  This occurred in the afternoon.  When his wife went to check his blood pressure, it was lower than normal, in the range of 90s over 80s.  He appeared ashen to her at the time.  Change in color resolved quickly and patient had normalization of his blood pressure.  EMS was called to his home.  EMS evaluated him on scene and did a twelve-lead EKG.  They noted frequent PVCs at the time.  He was brought to the ED.  Patient underwent EKG and lab work while in the ED waiting room.  He was informed that it would be a 9-hour wait and he decided to go home.  At the time, he was still experiencing chest pressure but no longer experiencing palpitations.  Chest pressure continued the following day.  He was able to go to work.  He spoke with his primary care doctor who advised him to come back to the ED.  Patient woke up this morning, he was asymptomatic.  He has not had any symptoms throughout the day today.  He returns to the ED due to concerns from his primary care doctor.     Home Medications Prior to Admission medications   Medication Sig Start Date End Date Taking? Authorizing Provider  acetaminophen (TYLENOL) 500 MG tablet Take 1,000 mg by mouth every 4 (four) hours as needed for fever.     [provider]  [employer] ASPIRIN ADULT LOW DOSE 81 MG chewable tablet Chew 81 mg by mouth daily. 02/25/20   [provider]  doxycycline (VIBRAMYCIN) 100 MG capsule Take 100 mg by mouth daily. 02/04/20   [provider]  losartan (COZAAR) 50 MG tablet Take 50  mg by mouth daily.    [provider]  meloxicam (MOBIC) 7.5 MG tablet Take 1 tablet (7.5 mg total) by mouth daily as needed for pain. 12/05/21   Trula Slade, DPM  metoprolol succinate (TOPROL-XL) 25 MG 24 hr tablet Take 25 mg by mouth daily. 02/25/20   [provider]  rosuvastatin (CRESTOR) 20 MG tablet TAKE 1 TABLET BY MOUTH DAILY. PATIENT MUST SCHEDULE APPOINTMENT FOR FUTURE REFILLS LAST ATTEMPT 12/04/21   Geralynn Rile, MD      Allergies    Morphine    Review of Systems   Review of Systems  Respiratory:  Positive for shortness of breath.   Cardiovascular:  Positive for chest pain and palpitations.  Skin:  Positive for pallor.  All other systems reviewed and are negative.   Physical Exam Updated Vital Signs BP (!) 144/78   Pulse (!) 54   Temp 98.3 F (36.8 C)   Resp (!) 25   Ht 5\' 7"  (1.702 m)   Wt 80.3 kg   SpO2 99%   BMI 27.72 kg/m  Physical Exam Vitals and nursing note reviewed.  Constitutional:      General: He is not in acute distress.    Appearance: He is well-developed and normal weight. He is not ill-appearing, toxic-appearing or diaphoretic.  HENT:  Head: Normocephalic and atraumatic.  Eyes:     Conjunctiva/sclera: Conjunctivae normal.  Neck:     Vascular: No JVD.  Cardiovascular:     Rate and Rhythm: Normal rate and regular rhythm.     Heart sounds: No murmur heard.    No friction rub. No gallop.  Pulmonary:     Effort: Pulmonary effort is normal. No respiratory distress.     Breath sounds: Normal breath sounds. No decreased breath sounds, wheezing, rhonchi or rales.  Chest:     Chest wall: No tenderness.  Abdominal:     Palpations: Abdomen is soft.     Tenderness: There is no abdominal tenderness.  Musculoskeletal:        General: No swelling.     Cervical back: Normal range of motion and neck supple.     Right lower leg: No edema.     Left lower leg: No edema.  Skin:    General: Skin is warm and dry.      Capillary Refill: Capillary refill takes less than 2 seconds.     Coloration: Skin is not cyanotic or pale.  Neurological:     General: No focal deficit present.     Mental Status: He is alert and oriented to person, place, and time.  Psychiatric:        Mood and Affect: Mood normal.        Behavior: Behavior normal.     ED Results / Procedures / Treatments   Labs (all labs ordered are listed, but only abnormal results are displayed) Labs Reviewed  BASIC METABOLIC PANEL - Abnormal; Notable for the following components:      Result Value   Glucose, Bld 102 (*)    All other components within normal limits  CBC - Abnormal; Notable for the following components:   WBC 3.6 (*)    All other components within normal limits  TROPONIN I (HIGH SENSITIVITY)  TROPONIN I (HIGH SENSITIVITY)    EKG EKG Interpretation  Date/Time:  Tuesday January 23 2022 06:38:05 EDT Ventricular Rate:  78 PR Interval:  144 QRS Duration: 84 QT Interval:  390 QTC Calculation: 444 R Axis:   -34 Text Interpretation: Normal sinus rhythm Left axis deviation Nonspecific ST abnormality Abnormal ECG When compared with ECG of 23-Jan-2022 06:37, PREVIOUS ECG IS PRESENT Confirmed by Wynona Dove ([id number]) on 01/23/2022 8:03:46 AM  Radiology CT Angio Chest PE W and/or Wo Contrast  Result Date: 01/23/2022 CLINICAL DATA:  Concern for pulmonary embolism. EXAM: CT ANGIOGRAPHY CHEST WITH CONTRAST TECHNIQUE: Multidetector CT imaging of the chest was performed using the standard protocol during bolus administration of intravenous contrast. Multiplanar CT image reconstructions and MIPs were obtained to evaluate the vascular anatomy. RADIATION DOSE REDUCTION: This exam was performed according to the departmental dose-optimization program which includes automated exposure control, adjustment of the mA and/or kV according to patient size and/or use of iterative reconstruction technique. CONTRAST:  49mL OMNIPAQUE IOHEXOL 350 MG/ML SOLN  COMPARISON:  Chest radiograph dated 01/23/2022. FINDINGS: Cardiovascular: There is no cardiomegaly or pericardial effusion. Mild atherosclerotic calcification of the thoracic aorta. No aneurysmal dilatation. No pulmonary artery embolus identified. Mediastinum/Nodes: No hilar or mediastinal adenopathy. The esophagus is grossly unremarkable. No mediastinal fluid collection. Lungs/Pleura: No focal consolidation, pleural effusion, or pneumothorax. The central airways are patent. Upper Abdomen: No acute abnormality. Musculoskeletal: No acute osseous pathology.  Degenerative changes. Review of the MIP images confirms the above findings. IMPRESSION: 1. No acute intrathoracic pathology. No CT evidence of pulmonary  artery embolus. 2.  Aortic Atherosclerosis (ICD10-I70.0). Electronically Signed   By: Anner Crete M.D.   On: 01/23/2022 22:00   DG Chest 2 View  Result Date: 01/23/2022 CLINICAL DATA:  Chest pain. EXAM: CHEST - 2 VIEW COMPARISON:  PA Lat 01/21/2022 FINDINGS: The heart size and mediastinal contours are within normal limits. Both lungs are clear. The visualized skeletal structures are intact, with mild thoracic spondylosis. IMPRESSION: No acute radiographic chest findings or interval changes. Electronically Signed   By: Telford Nab M.D.   On: 01/23/2022 07:04    Procedures Procedures    Medications Ordered in ED Medications  iohexol (OMNIPAQUE) 350 MG/ML injection 65 mL (65 mLs Intravenous Contrast Given 01/23/22 2151)    ED Course/ Medical Decision Making/ A&P                           Medical Decision Making Amount and/or Complexity of Data Reviewed Radiology: ordered.  Risk Prescription drug management.   This patient presents to the ED for concern of chest pain and palpitations, this involves an extensive number of treatment options, and is a complaint that carries with it a high risk of complications and morbidity.  The differential diagnosis includes ACS, PE, pericarditis,  arrhythmia, GERD   Co morbidities that complicate the patient evaluation  GERD, HTN   Additional history obtained:  Additional history obtained from patient's wife External records from outside source obtained and reviewed including EMR   Lab Tests:  I Ordered, and personally interpreted labs.  The pertinent results include: Normal hemoglobin, normal electrolytes, normal troponins x2   Imaging Studies ordered:  I ordered imaging studies including chest x-ray, CTA chest I independently visualized and interpreted imaging which showed no acute findings I agree with the radiologist interpretation   Cardiac Monitoring: / EKG:  The patient was maintained on a cardiac monitor.  I personally viewed and interpreted the cardiac monitored which showed an underlying rhythm of: Sinus rhythm   Problem List / ED Course / Critical interventions / Medication management  Patient presenting for chest heaviness.  This has been ongoing for the past 2 days.  At time of onset, he came to the ED.  At that time, he was given 324 ASA and 1 NTG.  He did feel improved symptoms following NTG.  Lab work from 2 days ago was reassuring.  Today, prior to being bedded in the ED, further lab work was obtained.  Lab shows normal troponins x2, normal hemoglobin, normal electrolytes.  EKGs from yesterday and today show new inferolateral T wave abnormalities when compared to EKG from 2 years ago.  On assessment, patient is well-appearing.  Breathing is unlabored.  No JVD is present.  No cardiac murmurs or rubs are appreciated on auscultation.  Vital signs are reassuring.  Patient reports that he has not had any symptoms at all today.  Given his recent symptoms, patient to be monitored in the ED and undergo CTA of chest.  Per chart review, patient had a clean heart cath 2 years ago.  CTA of chest did not show acute findings.  Patient remained asymptomatic throughout his ED observation time.  On the cardiac monitor, there  was a brief episode where he may have had an atrial flutter rhythm with variable conduction but was able to maintain a normal heart rate.  He denies feeling any symptoms at that time.  Patient is already on metoprolol.  His CHA2DS2-VASc score is 1.  I do not feel that he needs anticoagulation at this time.  Patient is stable for discharge.  I do feel that he would benefit from close cardiology follow-up given his recent symptoms, EKG changes from 2 years ago, as well as possible intermittent atrial flutter.  Cardiology referral was ordered.  Patient was encouraged to return to the ED for any new or worsening symptoms.  He was discharged in good condition.   Social Determinants of Health:  Has access to outpatient care         Final Clinical Impression(s) / ED Diagnoses Final diagnoses:  Chest pain, unspecified type  Palpitations    Rx / DC Orders ED Discharge Orders          Ordered    Ambulatory referral to Cardiology       Comments: If you have not heard from the Cardiology office within the next 72 hours please call [phone].   01/23/22 2218              Godfrey Pick, MD 01/23/22 2226

## 2022-01-23 NOTE — ED Triage Notes (Signed)
Reports chest heaviness ongoing since Sunday. Was seen here at [facility] prior to room placement.   Was notified by PCP he should return since they found some cardiac activity.   Sts constant chest pressure since.

## 2022-01-25 ENCOUNTER — Other Ambulatory Visit: Payer: Self-pay | Admitting: Cardiovascular Disease

## 2022-01-25 NOTE — Progress Notes (Signed)
Cardiology Clinic Note   Patient Name: Alexander Gross Date of Encounter: 01/26/2022  Primary Care Provider:  Seward Carol, MD Primary Cardiologist:  Dr. Audie Box  Patient Profile    61 year old male with history of mitral valve prolapse,non rheumatic MR, hypertension, HL and GERD. Left heart cath 03/15/2020 with normal coronaries  Last seen by Dr. Audie Box on 03/17/2020.   Past Medical History    Past Medical History:  Diagnosis Date   GERD (gastroesophageal reflux disease)    Headache    "daily since last Saturday" (10/27/2015)   Hypertension    Tick bite ~ 08/2015   bitten by multiple ticks about two months ago when hiking in [place]/notes 10/26/2015   Past Surgical History:  Procedure Laterality Date   LEFT HEART CATH AND CORONARY ANGIOGRAPHY N/A 03/08/2020   Procedure: LEFT HEART CATH AND CORONARY ANGIOGRAPHY;  Surgeon: Belva Crome, MD;  Location: [facility];  Service: Cardiovascular;  Laterality: N/A;   TONSILLECTOMY      Allergies  Allergies  Allergen Reactions   Morphine Nausea And Vomiting    History of Present Illness    Mr. Cournoyer returns today for ongoing assessment and management of chest pain for 2 days, with normal coronaries per cath 12/21, hypertension, and HL. He was seen in ED on 01/23/2022 for complaints of palpitations, chest discomfort and shortness of breath. Troponin were normal, EKG was negative for ACS, but did show new inferolateral T waves compared to EKG in 2021.  CTA of chest did not show acute findings.  On the cardiac monitor, there was a brief episode where he may have had an atrial flutter rhythm with variable conduction but was able to maintain a normal heart rate.   BP 144/78 and had improvement of symptoms with 1 sublingual NTG. He is here for cardiology follow up.   He comes today with his wife feeling some better but continues to have intermittent "heartburn" type pain on occasion.  He is not complaining of any rapid heartbeat or pounding  at this time.  Nothing new has been occurring concerning his medications, activity level, stress, OTC caffeinated products or products with pseudoephedrine.  He noticed racing heart and pounding while he was out hunting, also after he had been working in his yard, prior to visit to ED.  Home Medications    Current Outpatient Medications  Medication Sig Dispense Refill   [employer] ASPIRIN ADULT LOW DOSE 81 MG chewable tablet Chew 81 mg by mouth daily.     doxycycline (VIBRAMYCIN) 100 MG capsule Take 100 mg by mouth daily.     losartan (COZAAR) 50 MG tablet Take 50 mg by mouth daily.     metoprolol succinate (TOPROL-XL) 25 MG 24 hr tablet Take 25 mg by mouth daily.     rosuvastatin (CRESTOR) 20 MG tablet Take 1 tablet (20 mg total) by mouth daily. Keep upcoming appointment for future refills. 30 tablet 0   acetaminophen (TYLENOL) 500 MG tablet Take 1,000 mg by mouth every 4 (four) hours as needed for fever.  (Patient not taking: Reported on 01/26/2022)     meloxicam (MOBIC) 7.5 MG tablet Take 1 tablet (7.5 mg total) by mouth daily as needed for pain. (Patient not taking: Reported on 01/26/2022) 30 tablet 0   Current Facility-Administered Medications  Medication Dose Route Frequency Provider Last Rate Last Admin   sodium chloride flush (NS) 0.9 % injection 3 mL  3 mL Intravenous Q12H O'Neal, Cassie Freer, MD  Family History    Family History  Problem Relation Age of Onset   Heart disease Mother    Migraines Mother    Cancer Father    He indicated that his mother is deceased. He indicated that his father is deceased.  Social History    Social History   Socioeconomic History   Marital status: Married    Spouse name: Not on file   Number of children: 2   Years of education: Not on file   Highest education level: Not on file  Occupational History   Occupation: quality control   Tobacco Use   Smoking status: Never   Smokeless tobacco: Current    Types: Snuff  Vaping Use   Vaping  Use: Never used  Substance and Sexual Activity   Alcohol use: Yes    Alcohol/week: 12.0 standard drinks of alcohol    Types: 12 Cans of beer per week   Drug use: No   Sexual activity: Not Currently    Partners: Female  Other Topics Concern   Not on file  Social History Narrative   Right handed   Lives in a two story home with wife.   Social Determinants of Health   Financial Resource Strain: Not on file  Food Insecurity: Not on file  Transportation Needs: Not on file  Physical Activity: Not on file  Stress: Not on file  Social Connections: Not on file  Intimate Partner Violence: Not on file     Review of Systems    General:  No chills, fever, night sweats or weight changes.  Cardiovascular:  No chest pain, dyspnea on exertion, edema, orthopnea, palpitations, paroxysmal nocturnal dyspnea. Dermatological: No rash, lesions/masses Respiratory: No cough, dyspnea Urologic: No hematuria, dysuria Abdominal:   No nausea, vomiting, diarrhea, bright red blood per rectum, melena, or hematemesis Neurologic:  No visual changes, wkns, changes in mental status. All other systems reviewed and are otherwise negative except as noted above.     Physical Exam    VS:  BP 126/84 (BP Location: Left Arm, Patient Position: Sitting)   Pulse (!) 50   Ht 5\' 8"  (1.727 m)   Wt 174 lb 12.8 oz (79.3 kg)   SpO2 96%   BMI 26.58 kg/m  , BMI Body mass index is 26.58 kg/m.     GEN: Well nourished, well developed, in no acute distress. HEENT: normal. Neck: Supple, no JVD, carotid bruits, or masses. Cardiac: RRR, no murmurs, rubs, or gallops. No clubbing, cyanosis, edema.  Radials/DP/PT 2+ and equal bilaterally.  Respiratory:  Respirations regular and unlabored, clear to auscultation bilaterally. GI: Soft, nontender, nondistended, BS + x 4. MS: no deformity or atrophy. Skin: warm and dry, no rash. Neuro:  Strength and sensation are intact. Psych: Normal affect.  Accessory Clinical Findings     ECG personally reviewed by me today-sinus bradycardia heart rate 50 bpm, PR interval 150 ms,- No acute changes  Lab Results  Component Value Date   WBC 3.6 (L) 01/23/2022   HGB 15.3 01/23/2022   HCT 44.5 01/23/2022   MCV 95.7 01/23/2022   PLT 199 01/23/2022   Lab Results  Component Value Date   CREATININE 0.90 01/23/2022   BUN 7 01/23/2022   NA 138 01/23/2022   K 4.5 01/23/2022   CL 102 01/23/2022   CO2 23 01/23/2022   No results found for: "ALT", "AST", "GGT", "ALKPHOS", "BILITOT" No results found for: "CHOL", "HDL", "LDLCALC", "LDLDIRECT", "TRIG", "CHOLHDL"  No results found for: "HGBA1C"  Review  of Prior Studies: Echocardiogram 03/15/2020  1. The mitral valve is myxomatous. Mild mitral valve regurgitation. No  evidence of mitral stenosis. There is moderate holosystolic prolapse of  the medial scallop of the posterior leaflet of the mitral valve.   2. Left ventricular ejection fraction, by estimation, is 60 to 65%. Left  ventricular ejection fraction by 3D volume is 65 %. The left ventricle has  normal function. The left ventricle has no regional wall motion  abnormalities. Left ventricular diastolic   parameters were normal.   3. Right ventricular systolic function is normal. The right ventricular  size is normal. There is normal pulmonary artery systolic pressure. The  estimated right ventricular systolic pressure is 24.5 mmHg.   4. The aortic valve is tricuspid. Aortic valve regurgitation is trivial.  No aortic stenosis is present.   5. The inferior vena cava is normal in size with greater than 50%  respiratory variability, suggesting right atrial pressure of 3 mmHg.  Assessment & Plan   1.  Palpitations: The patient had racing heart rate, pounding heart rate, irregular heart rate, and hypotension leading him to ED visit on 01/23/2022.  EKG did not reveal any significant abnormalities with exception of frequent PVCs.  Rhythm strip obtained did reveal brief period of  atrial flutter resolving spontaneously.  We do not have a copy of this.  Lab values are reviewed potassium was normal.  No evidence of anemia.  I have discussed having him wear a 7-day ZIO monitor for definitive evaluation concerning frequency and morphology of arrhythmias.  I will also check a TSH to evaluate further concerning intrinsic causes of irregular heart rhythm.  He will return to the office for discussion of test results once made available.  He is currently on metoprolol 25 mg daily.  Will not make any adjustments at this time.  He is slightly bradycardic today in the office 50 bpm.  May need to decrease the dose if he should be found to have significant bradycardia, or heart block.  2.  Hypertension: Blood pressure currently controlled on losartan.  Kidney function was normal in ER.  Potassium 4.5.  Continue current medication regimen.  3.  Hypercholesterolemia: He is on rosuvastatin 20 mg daily.  Labs are followed by primary care provider Dr. Nehemiah Settle.  4.  Chest discomfort: Described as heartburn with some pressure on occasion.  Consider adding PPI.  No evidence of AAA on CT scan.  Cannot rule out coronary spasm.  He did find relief in the emergency room with nitroglycerin sublingual but pain returned approximately 30 minutes later.  Reluctant to start him on nitroglycerin until we have full report from cardiac monitor.  He was found to be hypotensive prior to going to the ED by home BP monitor.  Further recommendations based upon test results.   Current medicines are reviewed at length with the patient today.  I have spent 25 min's  dedicated to the care of this patient on the date of this encounter to include pre-visit review of records, assessment, management and diagnostic testing,with shared decision making.   Signed, Bettey Mare. Liborio Nixon, ANP, AACC   01/26/2022 1:17 PM      Office [phone] Fax [phone]  Notice: This dictation was prepared with Dragon dictation  along with smaller phrase technology. Any transcriptional errors that result from this process are unintentional and may not be corrected upon review.

## 2022-01-26 ENCOUNTER — Ambulatory Visit: Payer: BC Managed Care – PPO | Attending: Adult Health | Admitting: Adult Health

## 2022-01-26 ENCOUNTER — Ambulatory Visit (INDEPENDENT_AMBULATORY_CARE_PROVIDER_SITE_OTHER): Payer: BC Managed Care – PPO

## 2022-01-26 ENCOUNTER — Encounter: Payer: Self-pay | Admitting: Adult Health

## 2022-01-26 VITALS — BP 126/84 | HR 50 | Ht 68.0 in | Wt 174.8 lb

## 2022-01-26 DIAGNOSIS — I495 Sick sinus syndrome: Secondary | ICD-10-CM | POA: Diagnosis not present

## 2022-01-26 DIAGNOSIS — I10 Essential (primary) hypertension: Secondary | ICD-10-CM | POA: Diagnosis not present

## 2022-01-26 DIAGNOSIS — E7800 Pure hypercholesterolemia, unspecified: Secondary | ICD-10-CM

## 2022-01-26 DIAGNOSIS — R002 Palpitations: Secondary | ICD-10-CM | POA: Diagnosis not present

## 2022-01-26 DIAGNOSIS — R0789 Other chest pain: Secondary | ICD-10-CM

## 2022-01-26 NOTE — Patient Instructions (Signed)
Medication Instructions:  No Changes *If you need a refill on your cardiac medications before your next appointment, please call your pharmacy*   Lab Work: Tsh Today If you have labs (blood work) drawn today and your tests are completely normal, you will receive your results only by: [place] (if you have MyChart) OR A paper copy in the mail If you have any lab test that is abnormal or we need to change your treatment, we will call you to review the results.   Testing/Procedures: ZIO AT Long term monitor-Live Telemetry  Your physician has requested you wear a ZIO patch monitor for 7 days.  This is a single patch monitor. [employer] supplies one patch monitor per enrollment. Additional  stickers are not available.  Please do not apply patch if you will be having a Nuclear Stress Test, Echocardiogram, Cardiac CT, MRI,  or Chest Xray during the period you would be wearing the monitor. The patch cannot be worn during  these tests. You cannot remove and re-apply the ZIO AT patch monitor.  Your ZIO patch monitor will be mailed 3 day USPS to your address on file. It may take 3-5 days to  receive your monitor after you have been enrolled.  Once you have received your monitor, please review the enclosed instructions. Your monitor has  already been registered assigning a specific monitor serial # to you.   Billing and Patient Assistance Program information  Alexander Gross has been supplied with any insurance information on record for billing. [employer] offers a sliding scale Patient Assistance Program for patients without insurance, or whose  insurance does not completely cover the cost of the ZIO patch monitor. You must apply for the  Patient Assistance Program to qualify for the discounted rate. To apply, call [employer] at [phone],  select option 4, select option 2 , ask to apply for the Patient Assistance Program, (you can request an  interpreter if needed). [employer] will ask your household  income and how many people are in your  household. [employer] will quote your out-of-pocket cost based on this information. They will also be able  to set up a 12 month interest free payment plan if needed.  Applying the monitor   Shave hair from upper left chest.  Hold the abrader disc by orange tab. Rub the abrader in 40 strokes over left upper chest as indicated in  your monitor instructions.  Clean area with 4 enclosed alcohol pads. Use all pads to ensure the area is cleaned thoroughly. Let  dry.  Apply patch as indicated in monitor instructions. Patch will be placed under collarbone on left side of  chest with arrow pointing upward.  Rub patch adhesive wings for 2 minutes. Remove the white label marked "1". Remove the white label  marked "2". Rub patch adhesive wings for 2 additional minutes.  While looking in a mirror, press and release button in center of patch. A small green light will flash 3-4  times. This will be your only indicator that the monitor has been turned on.  Do not shower for the first 24 hours. You may shower after the first 24 hours.  Press the button if you feel a symptom. You will hear a small click. Record Date, Time and Symptom in  the Patient Log.   Starting the Gateway  In your kit there is a Hydrographic surveyor box the size of a cellphone. This is Airline pilot. It transmits all your  recorded data to [facility]. This box must  always stay within 10 feet of you. Open the box and push the *  button. There will be a light that blinks orange and then green a few times. When the light stops  blinking, the Gateway is connected to the ZIO patch. Call [employer] at [phone] to confirm your monitor is transmitting.  Returning your monitor  Remove your patch and place it inside the [place]. In the lower half of the Gateway there is a white  bag with prepaid postage on it. Place Gateway in bag and seal. Mail package back to [place] as soon as  possible. Your physician should  have your final report approximately 7 days after you have mailed back  your monitor. Call [facility] at [phone] if you have questions regarding your ZIO AT  patch monitor. Call them immediately if you see an orange light blinking on your monitor.  If your monitor falls off in less than 4 days, contact our Monitor department at [phone]. If your  monitor becomes loose or falls off after 4 days call [employer] at [phone] for suggestions on  securing your monitor    Follow-Up: At [facility], you and your health needs are our priority.  As part of our continuing mission to provide you with exceptional heart care, we have created designated Provider Care Teams.  These Care Teams include your primary Cardiologist (physician) and Advanced Practice Providers (APPs -  Physician Assistants and Nurse Practitioners) who all work together to provide you with the care you need, when you need it.  We recommend signing up for the patient portal called "MyChart".  Sign up information is provided on this After Visit Summary.  MyChart is used to connect with patients for Virtual Visits (Telemedicine).  Patients are able to view lab/test results, encounter notes, upcoming appointments, etc.  Non-urgent messages can be sent to your provider as well.   To learn more about what you can do with MyChart, go to [email or web address].    Your next appointment:   1 month(s)  The format for your next appointment:   In Person  Provider:   Jory Sims, DNP, ANP    or ,Addison Naegeli. Audie Box, MD

## 2022-01-26 NOTE — Progress Notes (Unsigned)
Enrolled patient for a 7 day Zio XT monitor to be mailed to patients home  Dr O'Neal to read 

## 2022-01-27 LAB — TSH: TSH: 1.62 u[IU]/mL (ref 0.450–4.500)

## 2022-01-29 DIAGNOSIS — I495 Sick sinus syndrome: Secondary | ICD-10-CM

## 2022-01-29 DIAGNOSIS — R002 Palpitations: Secondary | ICD-10-CM

## 2022-02-05 DIAGNOSIS — L719 Rosacea, unspecified: Secondary | ICD-10-CM | POA: Diagnosis not present

## 2022-02-22 ENCOUNTER — Other Ambulatory Visit: Payer: Self-pay | Admitting: Cardiovascular Disease

## 2022-02-23 ENCOUNTER — Ambulatory Visit: Payer: BC Managed Care – PPO | Admitting: Adult Health

## 2022-02-26 ENCOUNTER — Telehealth: Payer: Self-pay

## 2022-02-26 NOTE — Telephone Encounter (Addendum)
Called patient regarding results. Left detailed message for patient regarding results.----- Message from Jodelle Gross, NP sent at 02/21/2022  6:10 PM EST ----- I have read the report by Dr. Flora Lipps and the cardiac monitor did not find any significant heart rhythm issues. He did have one episode of fast heart rate lasting about 6 seconds, which stopped on its own. No plans to add or change any medications. Continue watchful waiting.

## 2022-03-02 ENCOUNTER — Telehealth: Payer: Self-pay

## 2022-03-02 NOTE — Telephone Encounter (Addendum)
Called patient regarding results. Left message, letter mailed 12/8----- Message from Jodelle Gross, NP sent at 02/21/2022  6:10 PM EST ----- I have read the report by Dr. Flora Lipps and the cardiac monitor did not find any significant heart rhythm issues. He did have one episode of fast heart rate lasting about 6 seconds, which stopped on its own. No plans to add or change any medications. Continue watchful waiting.

## 2022-04-06 ENCOUNTER — Ambulatory Visit: Payer: BC Managed Care – PPO | Admitting: Adult Health

## 2022-05-04 ENCOUNTER — Ambulatory Visit: Payer: BC Managed Care – PPO | Admitting: Adult Health

## 2022-12-06 DIAGNOSIS — Z79899 Other long term (current) drug therapy: Secondary | ICD-10-CM | POA: Diagnosis not present

## 2022-12-06 DIAGNOSIS — E7800 Pure hypercholesterolemia, unspecified: Secondary | ICD-10-CM | POA: Diagnosis not present

## 2022-12-06 DIAGNOSIS — I10 Essential (primary) hypertension: Secondary | ICD-10-CM | POA: Diagnosis not present

## 2022-12-06 DIAGNOSIS — Z125 Encounter for screening for malignant neoplasm of prostate: Secondary | ICD-10-CM | POA: Diagnosis not present

## 2022-12-06 DIAGNOSIS — Z5181 Encounter for therapeutic drug level monitoring: Secondary | ICD-10-CM | POA: Diagnosis not present

## 2023-02-08 DIAGNOSIS — L719 Rosacea, unspecified: Secondary | ICD-10-CM | POA: Diagnosis not present

## 2023-03-09 ENCOUNTER — Other Ambulatory Visit: Payer: Self-pay | Admitting: Cardiovascular Disease

## 2023-04-02 ENCOUNTER — Other Ambulatory Visit: Payer: Self-pay | Admitting: Cardiovascular Disease

## 2023-04-12 ENCOUNTER — Other Ambulatory Visit: Payer: Self-pay | Admitting: Cardiovascular Disease

## 2023-04-25 ENCOUNTER — Other Ambulatory Visit: Payer: Self-pay | Admitting: Cardiovascular Disease

## 2023-06-02 ENCOUNTER — Other Ambulatory Visit: Payer: Self-pay | Admitting: Cardiovascular Disease

## 2023-08-12 ENCOUNTER — Other Ambulatory Visit: Payer: Self-pay | Admitting: Cardiovascular Disease
